# Patient Record
Sex: Male | Born: 1970 | Race: Black or African American | Hispanic: No | Marital: Single | Smoking: Light tobacco smoker
Health system: Southern US, Community
[De-identification: ages and names within clinical notes are randomized; demographics above are authoritative.]

## PROBLEM LIST (undated history)

## (undated) DIAGNOSIS — S0990XA Unspecified injury of head, initial encounter: Secondary | ICD-10-CM

## (undated) DIAGNOSIS — K37 Unspecified appendicitis: Secondary | ICD-10-CM

## (undated) DIAGNOSIS — W3400XA Accidental discharge from unspecified firearms or gun, initial encounter: Secondary | ICD-10-CM

## (undated) HISTORY — PX: APPENDECTOMY: SHX54

## (undated) HISTORY — PX: ABDOMINAL SURGERY: SHX537

## (undated) HISTORY — PX: KNEE SURGERY: SHX244

---

## 1996-04-03 DIAGNOSIS — W3400XA Accidental discharge from unspecified firearms or gun, initial encounter: Secondary | ICD-10-CM

## 1996-04-03 HISTORY — DX: Accidental discharge from unspecified firearms or gun, initial encounter: W34.00XA

## 2006-10-12 ENCOUNTER — Inpatient Hospital Stay (HOSPITAL_COMMUNITY): Admission: EM | Admit: 2006-10-12 | Discharge: 2006-10-14 | Payer: Self-pay | Admitting: Emergency Medicine

## 2007-07-18 ENCOUNTER — Emergency Department (HOSPITAL_COMMUNITY): Admission: EM | Admit: 2007-07-18 | Discharge: 2007-07-18 | Payer: Self-pay | Admitting: Emergency Medicine

## 2007-08-18 ENCOUNTER — Emergency Department (HOSPITAL_COMMUNITY): Admission: EM | Admit: 2007-08-18 | Discharge: 2007-08-18 | Payer: Self-pay | Admitting: Emergency Medicine

## 2008-02-19 ENCOUNTER — Ambulatory Visit (HOSPITAL_BASED_OUTPATIENT_CLINIC_OR_DEPARTMENT_OTHER): Admission: RE | Admit: 2008-02-19 | Discharge: 2008-02-19 | Payer: Self-pay | Admitting: Orthopedic Surgery

## 2008-05-09 ENCOUNTER — Emergency Department (HOSPITAL_COMMUNITY): Admission: EM | Admit: 2008-05-09 | Discharge: 2008-05-09 | Payer: Self-pay | Admitting: Emergency Medicine

## 2009-10-05 ENCOUNTER — Emergency Department (HOSPITAL_COMMUNITY): Admission: EM | Admit: 2009-10-05 | Discharge: 2009-10-05 | Payer: Self-pay | Admitting: Family Medicine

## 2010-01-23 ENCOUNTER — Emergency Department (HOSPITAL_COMMUNITY): Admission: EM | Admit: 2010-01-23 | Discharge: 2010-01-23 | Payer: Self-pay | Admitting: Family Medicine

## 2010-07-19 LAB — POCT RAPID STREP A (OFFICE): Streptococcus, Group A Screen (Direct): NEGATIVE

## 2010-08-16 NOTE — Op Note (Signed)
NAMECHASYN, Oconnor             ACCOUNT NO.:  [id number]   MEDICAL RECORD NO.:  [id number]          PATIENT TYPE:  AMB   LOCATION:  [facility]                         FACILITY:  [facility]   PHYSICIAN:  Ollen Gross, M.D.    DATE OF BIRTH:  31-May-1970   DATE OF PROCEDURE:  02/19/2008  DATE OF DISCHARGE:                               OPERATIVE REPORT   PREOPERATIVE DIAGNOSIS:  Right knee lateral meniscal tear.   POSTOPERATIVE DIAGNOSIS:  Right knee lateral meniscal tear plus anterior  cruciate ligament tear.   PROCEDURE:  Right knee arthroscopy with lateral meniscal debridement.   SURGEON:  Ollen Gross, M.D.   ASSISTANT:  None.   ANESTHESIA:  General.   ESTIMATED BLOOD LOSS:  Minimal.   DRAINS:  None.   COMPLICATIONS:  None.   CONDITION:  Stable to recovery.   BRIEF CLINICAL NOTE:  [name] is a 40 year old male who had an injury  to his right knee several months ago.  He had significant lateral sided  pain and mechanical symptoms.  Exam and history suggested lateral  meniscal tear confirmed by MRI.  MRI was also suggestive of possible  partial ACL tear.  He presents now for arthroscopy and debridement.   PROCEDURE IN DETAIL:  After successful administration of general  anesthetic a tourniquet was placed high on the right thigh and right  lower extremity prepped and draped in the usual sterile fashion.  Standard superomedial and inferolateral incisions were made.  Inflow  cannula passed superomedial and camera passed inferolateral.  Arthroscopic visualization proceeds.  Undersurface of the patella and  the trochlea looked normal.  The medial and lateral gutters were  visualized and looked normal.  Flexion and valgus force was applied to  the knee and the medial compartment is entered.  Spinal needle was used  to localize the inferomedial portal.  Small incision was made and  dilator placed.  The medial compartment looks completely normal.  The  intercondylar notch is  visualized and there is some bulbous degenerative  changes of the ACL as well as a fragment of the ACL which is flipped in  towards the anterior aspect of the joint.  Given that this is  potentially a cause of impingement I debrided it back to normal tissue.  It looks like there is at least a partial if not full tear of the ACL.  Some fibers do remain intact.  The lateral compartment is entered and  there is a tear in the body and anterior horn of the lateral meniscus.  This is debrided back to a stable base with basket and a 4.2 mm shaver  and sealed off with the ArthroCare device.  The joint is again  inspected.  There were no further tears or loose bodies noted.  There  were no chondral defects noted.  The arthroscopic equipment is then  removed from the inferior portals which were closed with interrupted 4-0  nylon.  Twenty mL of 0.25% Marcaine with epinephrine was injected into the knee  and then the inflow cannula was removed and that portal was closed with  nylon.  A bulky  sterile dressing was applied and he is awakened and  transported to recovery in a stable condition.      Ollen Gross, M.D.  Electronically Signed     FA/MEDQ  D:  02/19/2008  T:  02/19/2008  Job:  [id number]

## 2010-08-16 NOTE — Op Note (Signed)
Christopher Oconnor, Christopher Oconnor             ACCOUNT NO.:  [id number]   MEDICAL RECORD NO.:  [id number]          PATIENT TYPE:  INP   LOCATION:  2550                         FACILITY:  [facility]   PHYSICIAN:  Vanita Panda. Magnus Ivan, M.D.DATE OF BIRTH:  11/20/1970   DATE OF PROCEDURE:  10/12/2006  DATE OF DISCHARGE:                               OPERATIVE REPORT   PREOPERATIVE DIAGNOSIS:  Left hand abscess and cellulitis.   POSTOPERATIVE DIAGNOSES:  Lleft hand first web space and palmar abscess.   PROCEDURE:  Irrigation and debridement of left thumb and first web space  palmar abscess.   SURGEON:  Vanita Panda. Magnus Ivan, M.D.   ANESTHESIA:  General.   TOURNIQUET TIME:  20 minutes.   BLOOD LOSS:  Minimal.   ANTIBIOTICS:  IV Zosyn.   COMPLICATIONS:  None.   INDICATIONS:  Briefly, Christopher Oconnor is a 40 year old right-hand-dominant  male who noticed swelling in his left hand since Tuesday.  He has had a  wound in the first web space for some time now, but he is not sure what  that is from.  After continued pain and swelling in his hand, he came to  the emergency room today.  On examination he had, what I felt to be,  erythema and streaking in his arm with pain in the web space.  He was  able to flex and extend his thumb, but everything was quite painful to  him.  We recognized this was likely an abscess that needed to be  decompressed.  The risks and benefits of surgery were explained to him,  and he agreed to proceed with surgery.   PROCEDURE DESCRIPTION:  After informed consent was obtained and the  appropriate left arm was marked, he was brought to the operating room  and placed supine on the operating room table.  General anesthesia was  obtained and a nonsterile tourniquet was placed on his upper arm.  His  hand was prepped and draped with DuraPrep and sterile drapes.  A time-  out was called to identify the correct patient and correct extremity. I  raised the arm and elevated the  tourniquet to 250 mmHg pressure.  I then  made the incision directly in the first web space over the previous  small wound; gross milky purulence was encountered.  I then spread down  into the deep palmar space and thoroughly irrigated this wound and  decompressed the abscess.  I used bacitracin solution with normal saline  to thoroughly irrigate the wound.  I then made a small incision in the  palm at the distal carpal ligament, to make sure the deep space near the  transverse carpal ligament was without gross purulence.  It was found to  be clean.  I thoroughly irrigated this wound.  I made a second incision  on the dorsal surface of the thumb over the metacarpal and the MCP  joint, and found only edematous fluid in this area.  This was thoroughly  irrigated as well.  The thumb and palmar incision were closed with  interrupted 3-0 nylon suture; loosely closed the first web space  incision  after packing it with Xerofoam dressing.  The tourniquet was  let down at 20 minutes.  Hemostasis was obtained.  The hand pinkened  nicely.  I placed a well-padded sterile dressing over the wound.  The  patient was awakened,  extubated and taken to the recovery room in stable condition.  Cultures  were obtained intraoperatively, and he will be admitted on IV  antibiotics for serial exams.  Once this is improved and we have  cultures identified with an organism, we can transition to oral  antibiotics.      Vanita Panda. Magnus Ivan, M.D.  Electronically Signed     CYB/MEDQ  D:  10/12/2006  T:  10/14/2006  Job:  [id number]

## 2010-08-19 NOTE — Discharge Summary (Signed)
Christopher Oconnor, Christopher Oconnor             ACCOUNT NO.:  [id number]   MEDICAL RECORD NO.:  [id number]          PATIENT TYPE:  INP   LOCATION:  5037                         FACILITY:  [facility]   PHYSICIAN:  Vanita Panda. Magnus Ivan, M.D.DATE OF BIRTH:  1970/05/16   DATE OF ADMISSION:  10/12/2006  DATE OF DISCHARGE:  10/14/2006                               DISCHARGE SUMMARY   ADMITTING DIAGNOSIS:  Left hand abscess.   DISCHARGE DIAGNOSIS:  Left hand complicated abscess.   PROCEDURES:  Irrigation and debridement of left hand abscess.   HOSPITAL COURSE:  [name] is a 40 year old male who came to the  emergency room on the day of admission with pain in the first webspace  of his left hand and his palm.  He had findings consistent with an  abscess in the hand.  It was recommended he undergo irrigation and  debridement of this.  He was taken to the operating room on the day of  admission, where a left hand abscess was irrigated and debrided, and  there was gross purulence encountered.  The wound was packed, and he was  admitted for IV antibiotics.  For a detailed description of the  operation, please refer to the dictated operative note in the patient's  medical record.   The cultures did grow rare Gram negative rods, and he was on vancomycin  and Cipro.  By the day of discharge, the wound had improved greatly, and  it was felt that he could be discharged safely to home.  Instructions  were given for Dial soap soaks daily, as well as an antibiotic regimen.   DISPOSITION:  To home.   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  He was given prescriptions for Percocet for  pain, as well as oral Cipro and doxycycline.  Close followup will be in  the office in the next 2-4 days.      Vanita Panda. Magnus Ivan, M.D.  Electronically Signed     CYB/MEDQ  D:   11/11/2006  T:  11/11/2006  Job:  [id number]

## 2010-09-21 ENCOUNTER — Inpatient Hospital Stay (INDEPENDENT_AMBULATORY_CARE_PROVIDER_SITE_OTHER)
Admission: RE | Admit: 2010-09-21 | Discharge: 2010-09-21 | Disposition: A | Discharge status: Home / Self Care | Payer: 59 | Source: Ambulatory Visit | Attending: Emergency Medicine | Admitting: Emergency Medicine

## 2010-09-21 DIAGNOSIS — J45909 Unspecified asthma, uncomplicated: Secondary | ICD-10-CM

## 2011-01-03 LAB — POCT HEMOGLOBIN-HEMACUE: Hemoglobin: 15.4

## 2011-01-17 LAB — DIFFERENTIAL
Basophils Absolute: 0
Basophils Relative: 0
Monocytes Absolute: 0.7
Neutro Abs: 6.3

## 2011-01-17 LAB — CULTURE, BLOOD (ROUTINE X 2)
Culture: NO GROWTH
Culture: NO GROWTH

## 2011-01-17 LAB — POCT I-STAT CREATININE
Creatinine, Ser: 1.2
Operator id: 270651

## 2011-01-17 LAB — WOUND CULTURE

## 2011-01-17 LAB — CBC
Hemoglobin: 13.3
MCHC: 33.8
RDW: 13.2

## 2011-01-17 LAB — I-STAT 8, (EC8 V) (CONVERTED LAB)
BUN: 13
Chloride: 106
Glucose, Bld: 87
Potassium: 4.1
pH, Ven: 7.4 — ABNORMAL HIGH

## 2011-01-17 LAB — ANAEROBIC CULTURE

## 2011-05-28 ENCOUNTER — Emergency Department (HOSPITAL_COMMUNITY): Payer: 59

## 2011-05-28 ENCOUNTER — Emergency Department (HOSPITAL_COMMUNITY)
Admission: EM | Admit: 2011-05-28 | Discharge: 2011-05-28 | Disposition: A | Discharge status: Home / Self Care | Payer: 59 | Attending: Emergency Medicine | Admitting: Emergency Medicine

## 2011-05-28 ENCOUNTER — Encounter (HOSPITAL_COMMUNITY): Payer: Self-pay | Admitting: Emergency Medicine

## 2011-05-28 DIAGNOSIS — X58XXXA Exposure to other specified factors, initial encounter: Secondary | ICD-10-CM | POA: Insufficient documentation

## 2011-05-28 DIAGNOSIS — IMO0002 Reserved for concepts with insufficient information to code with codable children: Secondary | ICD-10-CM | POA: Insufficient documentation

## 2011-05-28 DIAGNOSIS — S39011A Strain of muscle, fascia and tendon of abdomen, initial encounter: Secondary | ICD-10-CM

## 2011-05-28 DIAGNOSIS — R05 Cough: Secondary | ICD-10-CM

## 2011-05-28 DIAGNOSIS — R109 Unspecified abdominal pain: Secondary | ICD-10-CM | POA: Insufficient documentation

## 2011-05-28 DIAGNOSIS — R059 Cough, unspecified: Secondary | ICD-10-CM | POA: Insufficient documentation

## 2011-05-28 HISTORY — DX: Unspecified appendicitis: K37

## 2011-05-28 LAB — DIFFERENTIAL
Basophils Relative: 0 % (ref 0–1)
Lymphs Abs: 1.5 10*3/uL (ref 0.7–4.0)
Monocytes Relative: 10 % (ref 3–12)
Neutro Abs: 4.6 10*3/uL (ref 1.7–7.7)
Neutrophils Relative %: 67 % (ref 43–77)

## 2011-05-28 LAB — COMPREHENSIVE METABOLIC PANEL
ALT: 21 U/L (ref 0–53)
Albumin: 3.7 g/dL (ref 3.5–5.2)
Alkaline Phosphatase: 71 U/L (ref 39–117)
BUN: 12 mg/dL (ref 6–23)
Chloride: 104 mEq/L (ref 96–112)
Glucose, Bld: 52 mg/dL — ABNORMAL LOW (ref 70–99)
Potassium: 4 mEq/L (ref 3.5–5.1)
Sodium: 140 mEq/L (ref 135–145)
Total Bilirubin: 0.2 mg/dL — ABNORMAL LOW (ref 0.3–1.2)

## 2011-05-28 LAB — GLUCOSE, CAPILLARY

## 2011-05-28 LAB — CBC
Hemoglobin: 14.2 g/dL (ref 13.0–17.0)
RBC: 4.58 MIL/uL (ref 4.22–5.81)

## 2011-05-28 LAB — URINALYSIS, ROUTINE W REFLEX MICROSCOPIC
Bilirubin Urine: NEGATIVE
Hgb urine dipstick: NEGATIVE
Ketones, ur: NEGATIVE mg/dL
Nitrite: NEGATIVE
pH: 7 (ref 5.0–8.0)

## 2011-05-28 LAB — LIPASE, BLOOD: Lipase: 50 U/L (ref 11–59)

## 2011-05-28 MED ORDER — BENZONATATE 100 MG PO CAPS: 100.0000 mg | ORAL_CAPSULE | Freq: Three times a day (TID) | ORAL | Status: AC

## 2011-05-28 NOTE — Discharge Instructions (Signed)

## 2011-05-28 NOTE — ED Provider Notes (Signed)
History     CSN: [id number]  Arrival date & time 05/28/11  1040   First MD Initiated Contact with Patient 05/28/11 1107      Chief Complaint  Patient presents with  . Abdominal Pain  . Cough    (Consider location/radiation/quality/duration/timing/severity/associated sxs/prior treatment) Patient is a 41 y.o. male presenting with abdominal pain and cough. The history is provided by the patient. No language interpreter was used.  Abdominal Pain The primary symptoms of the illness include abdominal pain. The primary symptoms of the illness do not include fever, fatigue, shortness of breath, nausea, vomiting, diarrhea or dysuria. Episode onset: 3 days ago. The onset of the illness was gradual. The problem has not changed since onset. The pain came on gradually. The abdominal pain is generalized. The abdominal pain does not radiate. The abdominal pain is relieved by nothing. The abdominal pain is exacerbated by movement.  The patient has not had a change in bowel habit. Symptoms associated with the illness do not include chills, anorexia, constipation, urgency, frequency or back pain.  Cough This is a new problem. The current episode started more than 2 days ago. The problem occurs every few minutes. The problem has not changed since onset.The cough is non-productive. There has been no fever. Pertinent negatives include no chest pain, no chills, no sweats, no ear congestion, no ear pain, no headaches, no rhinorrhea, no sore throat, no myalgias, no shortness of breath and no eye redness.    Past Medical History  Diagnosis Date  . Appendicitis     Past Surgical History  Procedure Date  . Abdominal surgery     History reviewed. No pertinent family history.  History  Substance Use Topics  . Smoking status: Current Some Day Smoker  . Smokeless tobacco: Not on file  . Alcohol Use: Yes      Review of Systems  Constitutional: Negative for fever, chills, activity change, appetite  change and fatigue.  HENT: Positive for congestion. Negative for ear pain, sore throat, rhinorrhea, neck pain and neck stiffness.   Eyes: Negative for redness.  Respiratory: Positive for cough. Negative for shortness of breath.   Cardiovascular: Negative for chest pain and palpitations.  Gastrointestinal: Positive for abdominal pain. Negative for nausea, vomiting, diarrhea, constipation and anorexia.  Genitourinary: Negative for dysuria, urgency, frequency and flank pain.  Musculoskeletal: Negative for myalgias, back pain and arthralgias.  Neurological: Negative for dizziness, weakness, light-headedness, numbness and headaches.  All other systems reviewed and are negative.    Allergies  Review of patient's allergies indicates no known allergies.  Home Medications   Current Outpatient Rx  Name Route Sig Dispense Refill  . HYDROCODONE-ACETAMINOPHEN 5-325 MG PO TABS Oral Take 1 tablet by mouth every 4 (four) hours as needed. For pain.    Marland Kitchen NAPROXEN SODIUM 220 MG PO TABS Oral Take 220 mg by mouth 2 (two) times daily as needed. For pain.    Marland Kitchen BENZONATATE 100 MG PO CAPS Oral Take 1 capsule (100 mg total) by mouth every 8 (eight) hours. 21 capsule 0    BP 116/67  Pulse 65  Temp(Src) 98 F (36.7 C) (Oral)  Resp 18  SpO2 100%  Physical Exam  Nursing note and vitals reviewed. Constitutional: He is oriented to person, place, and time. He appears well-developed and well-nourished. No distress.  HENT:  Head: Normocephalic and atraumatic.  Mouth/Throat: Oropharynx is clear and moist.  Eyes: Conjunctivae and EOM are normal. Pupils are equal, round, and reactive to light.  Neck: Normal range of motion. Neck supple.  Cardiovascular: Normal rate, regular rhythm, normal heart sounds and intact distal pulses.  Exam reveals no gallop and no friction rub.   No murmur heard. Pulmonary/Chest: Effort normal and breath sounds normal. No respiratory distress.  Abdominal: Soft. Bowel sounds are  normal. There is tenderness (suprapubic abd pain). There is no rebound and no guarding.       Prior abd surgery  Musculoskeletal: Normal range of motion. He exhibits no tenderness.  Neurological: He is alert and oriented to person, place, and time. No cranial nerve deficit.  Skin: Skin is warm and dry. No rash noted.    ED Course  Procedures (including critical care time)  Labs Reviewed  COMPREHENSIVE METABOLIC PANEL - Abnormal; Notable for the following:    Glucose, Bld 52 (*)    Total Bilirubin 0.2 (*)    All other components within normal limits  URINALYSIS, ROUTINE W REFLEX MICROSCOPIC - Abnormal; Notable for the following:    APPearance CLOUDY (*)    All other components within normal limits  GLUCOSE, CAPILLARY - Abnormal; Notable for the following:    Glucose-Capillary 143 (*)    All other components within normal limits  CBC  DIFFERENTIAL  LIPASE, BLOOD   Dg Abd Acute W/chest  05/28/2011  *RADIOLOGY REPORT*  Clinical Data: Nominal pain  ACUTE ABDOMEN SERIES (ABDOMEN 2 VIEW & CHEST 1 VIEW)  Comparison: 04/09/2007  Findings: Normal heart size.  Clear lungs.  No pneumothorax and no pleural effusion.  No disproportionate dilatation of bowel.  No free intraperitoneal gas.  IMPRESSION: No active cardiopulmonary disease.  Nonobstructive bowel gas pattern.  Original Report Authenticated By: Donavan Burnet, M.D.     1. Cough   2. Abdominal wall strain       MDM  Cough with abdominal pain secondary to abdominal wall strain. Given the history of recent abdominal surgeries an acute abdominal series was performed with no evidence of obstruction. Patient will be discharged home with [facility]. He has pain medication from a previous visit yesterday. Laboratory work is unremarkable. He did have a glucose of 52 for which he ate and had resolution of this. Instructed to followup with his primary care physician        Dayton Bailiff, MD 05/28/11 1308

## 2011-05-28 NOTE — ED Notes (Signed)
Pt given orange juice, sandwich and crackers. Will monitor sugar.

## 2011-05-28 NOTE — ED Notes (Signed)
Pt presents to department for evaluation of lower abdominal pain. Onset Thursday. Pt states 4/10 pain at the time. Abdomen soft and nontender to palpation. Bowel sounds present all quadrants. No nausea/vomiting. No urinary symptoms. Pt conscious alert and oriented x4. No signs of distress at the time.

## 2011-05-28 NOTE — ED Notes (Signed)
Pt c/o abdominal pain onset Thursday with cough.

## 2012-02-01 ENCOUNTER — Encounter (HOSPITAL_COMMUNITY): Payer: Self-pay | Admitting: Emergency Medicine

## 2012-02-01 ENCOUNTER — Emergency Department (HOSPITAL_COMMUNITY)
Admission: EM | Admit: 2012-02-01 | Discharge: 2012-02-01 | Disposition: A | Discharge status: Home / Self Care | Payer: 59 | Source: Home / Self Care

## 2012-02-01 DIAGNOSIS — M79603 Pain in arm, unspecified: Secondary | ICD-10-CM

## 2012-02-01 DIAGNOSIS — M79609 Pain in unspecified limb: Secondary | ICD-10-CM

## 2012-02-01 MED ORDER — DICLOFENAC SODIUM 75 MG PO TBEC: 75.0000 mg | DELAYED_RELEASE_TABLET | Freq: Two times a day (BID) | ORAL | Status: DC

## 2012-02-01 NOTE — ED Notes (Signed)
C/O right upper arm "tightness" for 3 weeks. Hx of neck pain. Denies tingling or numbness in fingers.

## 2012-02-01 NOTE — ED Provider Notes (Signed)
History     CSN: [id number]  Arrival date & time 02/01/12  0941   None     Chief Complaint  Patient presents with  . Arm Injury    (Consider location/radiation/quality/duration/timing/severity/associated sxs/prior treatment) Patient is a 41 y.o. male presenting with arm injury. The history is provided by the patient. No language interpreter was used.  Arm Injury  The incident occurred just prior to arrival. The incident occurred at home. The injury mechanism is unknown. There is an injury to the right upper arm. The patient is experiencing no pain. There were no sick contacts.    Past Medical History  Diagnosis Date  . Appendicitis     Past Surgical History  Procedure Date  . Abdominal surgery     No family history on file.  History  Substance Use Topics  . Smoking status: Current Some Day Smoker  . Smokeless tobacco: Not on file  . Alcohol Use: Yes      Review of Systems  All other systems reviewed and are negative.    Allergies  Review of patient's allergies indicates no known allergies.  Home Medications   Current Outpatient Rx  Name Route Sig Dispense Refill  . HYDROCODONE-ACETAMINOPHEN 5-325 MG PO TABS Oral Take 1 tablet by mouth every 4 (four) hours as needed. For pain.    Marland Kitchen NAPROXEN SODIUM 220 MG PO TABS Oral Take 220 mg by mouth 2 (two) times daily as needed. For pain.      BP 128/83  Pulse 70  Temp 98 F (36.7 C) (Oral)  Resp 16  SpO2 100%  Physical Exam  Nursing note and vitals reviewed. Constitutional: He appears well-developed and well-nourished.  HENT:  Head: Normocephalic and atraumatic.  Musculoskeletal: He exhibits tenderness.       Tender right bicep area, no swelling,  Good pulse, full range of motion,  Ns and nv intact  Neurological: He is alert.  Skin: Skin is warm.    ED Course  Procedures (including critical care time)  Labs Reviewed - No data to display No results found.   No diagnosis found.    MDM    Pt  given rx for voltaren.   I advised follow up with [facility] if pain persist      Lonia Skinner [place], [place] 02/01/12 1107

## 2012-02-01 NOTE — ED Provider Notes (Signed)
Medical screening examination/treatment/procedure(s) were performed by non-physician practitioner and as supervising physician I was immediately available for consultation/collaboration.  Raynald Blend, MD 02/01/12 1122

## 2013-03-18 ENCOUNTER — Emergency Department (HOSPITAL_COMMUNITY)
Admission: EM | Admit: 2013-03-18 | Discharge: 2013-03-18 | Disposition: A | Discharge status: Home / Self Care | Payer: 59 | Attending: Emergency Medicine | Admitting: Emergency Medicine

## 2013-03-18 ENCOUNTER — Encounter (HOSPITAL_COMMUNITY): Payer: Self-pay | Admitting: Emergency Medicine

## 2013-03-18 DIAGNOSIS — R1084 Generalized abdominal pain: Secondary | ICD-10-CM | POA: Insufficient documentation

## 2013-03-18 DIAGNOSIS — Z8719 Personal history of other diseases of the digestive system: Secondary | ICD-10-CM | POA: Insufficient documentation

## 2013-03-18 DIAGNOSIS — Z87828 Personal history of other (healed) physical injury and trauma: Secondary | ICD-10-CM | POA: Insufficient documentation

## 2013-03-18 DIAGNOSIS — F17200 Nicotine dependence, unspecified, uncomplicated: Secondary | ICD-10-CM | POA: Insufficient documentation

## 2013-03-18 DIAGNOSIS — Z9089 Acquired absence of other organs: Secondary | ICD-10-CM | POA: Insufficient documentation

## 2013-03-18 DIAGNOSIS — R109 Unspecified abdominal pain: Secondary | ICD-10-CM

## 2013-03-18 HISTORY — DX: Accidental discharge from unspecified firearms or gun, initial encounter: W34.00XA

## 2013-03-18 LAB — CBC WITH DIFFERENTIAL/PLATELET
Basophils Absolute: 0 10*3/uL (ref 0.0–0.1)
Basophils Relative: 0 % (ref 0–1)
Eosinophils Absolute: 0.2 10*3/uL (ref 0.0–0.7)
HCT: 43.4 % (ref 39.0–52.0)
Hemoglobin: 14.9 g/dL (ref 13.0–17.0)
Lymphocytes Relative: 30 % (ref 12–46)
Lymphs Abs: 1.4 10*3/uL (ref 0.7–4.0)
MCHC: 34.3 g/dL (ref 30.0–36.0)
MCV: 91.4 fL (ref 78.0–100.0)
Monocytes Absolute: 0.5 10*3/uL (ref 0.1–1.0)
Monocytes Relative: 12 % (ref 3–12)
Neutro Abs: 2.5 10*3/uL (ref 1.7–7.7)
RBC: 4.75 MIL/uL (ref 4.22–5.81)
RDW: 12.7 % (ref 11.5–15.5)
WBC: 4.6 10*3/uL (ref 4.0–10.5)

## 2013-03-18 LAB — URINALYSIS, ROUTINE W REFLEX MICROSCOPIC
Glucose, UA: NEGATIVE mg/dL
Hgb urine dipstick: NEGATIVE
Leukocytes, UA: NEGATIVE
pH: 7 (ref 5.0–8.0)

## 2013-03-18 LAB — BASIC METABOLIC PANEL
CO2: 32 mEq/L (ref 19–32)
Chloride: 100 mEq/L (ref 96–112)
Creatinine, Ser: 1.15 mg/dL (ref 0.50–1.35)
GFR calc non Af Amer: 78 mL/min — ABNORMAL LOW (ref >90)
Glucose, Bld: 94 mg/dL (ref 70–99)
Sodium: 138 mEq/L (ref 135–145)

## 2013-03-18 MED ORDER — TRAMADOL HCL 50 MG PO TABS: 50.0000 mg | ORAL_TABLET | Freq: Four times a day (QID) | ORAL | Status: DC | PRN

## 2013-03-18 MED ORDER — OXYCODONE-ACETAMINOPHEN 5-325 MG PO TABS
2.0000 | ORAL_TABLET | Freq: Once | ORAL | Status: AC
  Administered 2013-03-18: 2 via ORAL
  Filled 2013-03-18: qty 2

## 2013-03-18 MED ORDER — ONDANSETRON 4 MG PO TBDP
4.0000 mg | ORAL_TABLET | Freq: Once | ORAL | Status: AC
  Administered 2013-03-18: 4 mg via ORAL
  Filled 2013-03-18: qty 1
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
## 2013-03-18 NOTE — ED Notes (Addendum)
Pt reports intermittent lower abdominal pain x "years" but progressively worse since Saturday. Denies pain at this time. Denies nausea, diarrhea. Last normal BM yesterday.

## 2013-03-19 LAB — URINE CULTURE: Culture: NO GROWTH

## 2013-03-19 NOTE — ED Provider Notes (Signed)
Medical screening examination/treatment/procedure(s) were performed by non-physician practitioner and as supervising physician I was immediately available for consultation/collaboration.  EKG Interpretation   None       Devoria Albe, MD, Armando Gang   Ward Givens, MD 03/19/13 [phone]

## 2013-03-23 ENCOUNTER — Emergency Department (HOSPITAL_COMMUNITY)
Admission: EM | Admit: 2013-03-23 | Discharge: 2013-03-23 | Disposition: A | Discharge status: Home / Self Care | Payer: 59 | Attending: Emergency Medicine | Admitting: Emergency Medicine

## 2013-03-23 ENCOUNTER — Emergency Department (HOSPITAL_COMMUNITY): Payer: 59

## 2013-03-23 ENCOUNTER — Encounter (HOSPITAL_COMMUNITY): Payer: Self-pay | Admitting: Emergency Medicine

## 2013-03-23 DIAGNOSIS — R109 Unspecified abdominal pain: Secondary | ICD-10-CM

## 2013-03-23 DIAGNOSIS — Z87828 Personal history of other (healed) physical injury and trauma: Secondary | ICD-10-CM | POA: Insufficient documentation

## 2013-03-23 DIAGNOSIS — G8918 Other acute postprocedural pain: Secondary | ICD-10-CM | POA: Insufficient documentation

## 2013-03-23 DIAGNOSIS — Z8719 Personal history of other diseases of the digestive system: Secondary | ICD-10-CM | POA: Insufficient documentation

## 2013-03-23 DIAGNOSIS — F17200 Nicotine dependence, unspecified, uncomplicated: Secondary | ICD-10-CM | POA: Insufficient documentation

## 2013-03-23 LAB — CBC
HCT: 41.4 % (ref 39.0–52.0)
MCH: 31.1 pg (ref 26.0–34.0)
MCHC: 34.3 g/dL (ref 30.0–36.0)
MCV: 90.6 fL (ref 78.0–100.0)
Platelets: 254 10*3/uL (ref 150–400)
RBC: 4.57 MIL/uL (ref 4.22–5.81)
RDW: 12.6 % (ref 11.5–15.5)

## 2013-03-23 LAB — POCT I-STAT, CHEM 8
Calcium, Ion: 1.23 mmol/L (ref 1.12–1.23)
Chloride: 101 mEq/L (ref 96–112)
Creatinine, Ser: 1.3 mg/dL (ref 0.50–1.35)
HCT: 44 % (ref 39.0–52.0)
Hemoglobin: 15 g/dL (ref 13.0–17.0)
TCO2: 29 mmol/L (ref 0–100)

## 2013-03-23 MED ORDER — OXYCODONE-ACETAMINOPHEN 5-325 MG PO TABS
1.0000 | ORAL_TABLET | Freq: Once | ORAL | Status: AC
  Administered 2013-03-23: 1 via ORAL
  Filled 2013-03-23: qty 1

## 2013-03-23 MED ORDER — IOHEXOL 300 MG/ML  SOLN
100.0000 mL | Freq: Once | INTRAMUSCULAR | Status: AC | PRN
  Administered 2013-03-23: 100 mL via INTRAVENOUS

## 2013-03-23 MED ORDER — TRAMADOL HCL 50 MG PO TABS: 50.0000 mg | ORAL_TABLET | Freq: Four times a day (QID) | ORAL | Status: DC | PRN

## 2013-03-23 NOTE — ED Notes (Signed)
Pt reports continues to have abdominal pain ongoing for years. Pt seen here on the 16th for same. Reports when he picked up his prescriptions he noticed it was not the same medication that he was given while here in ED. Pt denies N/V/D.

## 2013-03-23 NOTE — ED Notes (Signed)
Patient finished with oral contrast.

## 2013-03-23 NOTE — ED Provider Notes (Signed)
Medical screening examination/treatment/procedure(s) were performed by non-physician practitioner and as supervising physician I was immediately available for consultation/collaboration.  EKG Interpretation   None         Gilda Crease, MD 03/23/13 [phone]

## 2013-03-23 NOTE — ED Notes (Signed)
Patient returned from CT
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
## 2013-03-23 NOTE — ED Notes (Signed)
Patient transported to CT 

## 2013-07-20 ENCOUNTER — Encounter (HOSPITAL_COMMUNITY): Payer: Self-pay | Admitting: Emergency Medicine

## 2013-07-20 ENCOUNTER — Emergency Department (HOSPITAL_COMMUNITY): Payer: 59

## 2013-07-20 ENCOUNTER — Emergency Department (HOSPITAL_COMMUNITY)
Admission: EM | Admit: 2013-07-20 | Discharge: 2013-07-20 | Disposition: A | Discharge status: Home / Self Care | Payer: 59 | Attending: Emergency Medicine | Admitting: Emergency Medicine

## 2013-07-20 DIAGNOSIS — F17200 Nicotine dependence, unspecified, uncomplicated: Secondary | ICD-10-CM | POA: Insufficient documentation

## 2013-07-20 DIAGNOSIS — R143 Flatulence: Secondary | ICD-10-CM

## 2013-07-20 DIAGNOSIS — Z9889 Other specified postprocedural states: Secondary | ICD-10-CM | POA: Insufficient documentation

## 2013-07-20 DIAGNOSIS — K660 Peritoneal adhesions (postprocedural) (postinfection): Secondary | ICD-10-CM | POA: Insufficient documentation

## 2013-07-20 DIAGNOSIS — R112 Nausea with vomiting, unspecified: Secondary | ICD-10-CM | POA: Insufficient documentation

## 2013-07-20 DIAGNOSIS — R141 Gas pain: Secondary | ICD-10-CM | POA: Insufficient documentation

## 2013-07-20 DIAGNOSIS — R109 Unspecified abdominal pain: Secondary | ICD-10-CM

## 2013-07-20 DIAGNOSIS — Z87828 Personal history of other (healed) physical injury and trauma: Secondary | ICD-10-CM | POA: Insufficient documentation

## 2013-07-20 DIAGNOSIS — R142 Eructation: Secondary | ICD-10-CM

## 2013-07-20 DIAGNOSIS — Z87442 Personal history of urinary calculi: Secondary | ICD-10-CM | POA: Insufficient documentation

## 2013-07-20 HISTORY — DX: Unspecified injury of head, initial encounter: S09.90XA

## 2013-07-20 LAB — COMPREHENSIVE METABOLIC PANEL
ALBUMIN: 3.9 g/dL (ref 3.5–5.2)
ALK PHOS: 65 U/L (ref 39–117)
ALT: 22 U/L (ref 0–53)
AST: 21 U/L (ref 0–37)
BUN: 14 mg/dL (ref 6–23)
CO2: 26 mEq/L (ref 19–32)
CREATININE: 0.87 mg/dL (ref 0.50–1.35)
Calcium: 9 mg/dL (ref 8.4–10.5)
Chloride: 105 mEq/L (ref 96–112)
GFR calc non Af Amer: >90 mL/min (ref >90)
GLUCOSE: 103 mg/dL — AB (ref 70–99)
POTASSIUM: 4.2 meq/L (ref 3.7–5.3)
Sodium: 142 mEq/L (ref 137–147)
Total Bilirubin: 0.3 mg/dL (ref 0.3–1.2)
Total Protein: 7.1 g/dL (ref 6.0–8.3)

## 2013-07-20 LAB — CBC WITH DIFFERENTIAL/PLATELET
BASOS PCT: 0 % (ref 0–1)
Basophils Absolute: 0 10*3/uL (ref 0.0–0.1)
EOS ABS: 0.1 10*3/uL (ref 0.0–0.7)
Eosinophils Relative: 1 % (ref 0–5)
HCT: 44.1 % (ref 39.0–52.0)
HEMOGLOBIN: 14.7 g/dL (ref 13.0–17.0)
LYMPHS ABS: 1 10*3/uL (ref 0.7–4.0)
Lymphocytes Relative: 15 % (ref 12–46)
MCH: 30.3 pg (ref 26.0–34.0)
MCHC: 33.3 g/dL (ref 30.0–36.0)
MCV: 90.9 fL (ref 78.0–100.0)
MONO ABS: 0.6 10*3/uL (ref 0.1–1.0)
MONOS PCT: 9 % (ref 3–12)
NEUTROS PCT: 75 % (ref 43–77)
Neutro Abs: 5 10*3/uL (ref 1.7–7.7)
Platelets: 225 10*3/uL (ref 150–400)
RBC: 4.85 MIL/uL (ref 4.22–5.81)
RDW: 12.9 % (ref 11.5–15.5)
WBC: 6.6 10*3/uL (ref 4.0–10.5)

## 2013-07-20 LAB — LIPASE, BLOOD: LIPASE: 26 U/L (ref 11–59)

## 2013-07-20 MED ORDER — HYDROCODONE-ACETAMINOPHEN 5-325 MG PO TABS: 1.0000 | ORAL_TABLET | Freq: Four times a day (QID) | ORAL | Status: DC | PRN

## 2013-07-20 MED ORDER — IOHEXOL 300 MG/ML  SOLN
100.0000 mL | Freq: Once | INTRAMUSCULAR | Status: AC | PRN
  Administered 2013-07-20: 100 mL via INTRAVENOUS

## 2013-07-20 MED ORDER — HYDROMORPHONE HCL PF 1 MG/ML IJ SOLN
1.0000 mg | Freq: Once | INTRAMUSCULAR | Status: DC
  Filled 2013-07-20: qty 1

## 2013-07-20 MED ORDER — IOHEXOL 300 MG/ML  SOLN
25.0000 mL | INTRAMUSCULAR | Status: DC | PRN
  Administered 2013-07-20: 25 mL via ORAL

## 2013-07-20 MED ORDER — HYDROMORPHONE HCL PF 1 MG/ML IJ SOLN
1.0000 mg | Freq: Once | INTRAMUSCULAR | Status: AC
  Administered 2013-07-20: 1 mg via INTRAVENOUS
  Filled 2013-07-20: qty 1

## 2013-07-20 MED ORDER — ONDANSETRON HCL 4 MG/2ML IJ SOLN
4.0000 mg | Freq: Once | INTRAMUSCULAR | Status: AC
  Administered 2013-07-20: 4 mg via INTRAVENOUS
  Filled 2013-07-20: qty 2

## 2013-07-20 MED ORDER — POLYETHYLENE GLYCOL 3350 17 GM/SCOOP PO POWD: ORAL | Status: DC

## 2013-07-20 MED ORDER — SODIUM CHLORIDE 0.9 % IV BOLUS (SEPSIS)
1000.0000 mL | Freq: Once | INTRAVENOUS | Status: AC
  Administered 2013-07-20: 1000 mL via INTRAVENOUS

## 2013-07-20 MED ORDER — MORPHINE SULFATE 4 MG/ML IJ SOLN
4.0000 mg | Freq: Once | INTRAMUSCULAR | Status: AC
  Administered 2013-07-20: 4 mg via INTRAVENOUS
  Filled 2013-07-20: qty 1

## 2013-07-20 NOTE — Discharge Instructions (Signed)
Abdominal Pain, Adult Many things can cause abdominal pain. Usually, abdominal pain is not caused by a disease and will improve without treatment. It can often be observed and treated at home. Your health care provider will do a physical exam and possibly order blood tests and X-rays to help determine the seriousness of your pain. However, in many cases, more time must pass before a clear cause of the pain can be found. Before that point, your health care provider may not know if you need more testing or further treatment. HOME CARE INSTRUCTIONS  Monitor your abdominal pain for any changes. The following actions may help to alleviate any discomfort you are experiencing:  Only take over-the-counter or prescription medicines as directed by your health care provider.  Do not take laxatives unless directed to do so by your health care provider.  Try a clear liquid diet (broth, tea, or water) as directed by your health care provider. Slowly move to a bland diet as tolerated. SEEK MEDICAL CARE IF:  You have unexplained abdominal pain.  You have abdominal pain associated with nausea or diarrhea.  You have pain when you urinate or have a bowel movement.  You experience abdominal pain that wakes you in the night.  You have abdominal pain that is worsened or improved by eating food.  You have abdominal pain that is worsened with eating fatty foods. SEEK IMMEDIATE MEDICAL CARE IF:   Your pain does not go away within 2 hours.  You have a fever.  You keep throwing up (vomiting).  Your pain is felt only in portions of the abdomen, such as the right side or the left lower portion of the abdomen.  You pass bloody or black tarry stools. MAKE SURE YOU:  Understand these instructions.   Will watch your condition.   Will get help right away if you are not doing well or get worse.  Document Released: 12/28/2004 Document Revised: 01/08/2013 Document Reviewed: 11/27/2012 [facility] Patient  Information 2014 [place], [place].  Adhesions Adhesions are stringy (fibrous) bands of tissue. Adhesions are similar to scars, but they are on the inside of your body. Adhesions form between two surfaces of the body. CAUSES   The most common adhesions are those that occur following surgery. Touching and moving things within the belly (abdomen) leads to inflammation and adhesions can form. Not all people get adhesions following surgery. But, adhesions may happen even with the gentlest handling of the organs in the abdomen. There is no way to predict who will have adhesions. The adhesions can occur between:  Loops of bowel.  The bowel and other organs such as the liver.  The contents in the pelvis such as the uterus, bladder, ovaries and tubes.  Inflammation within the abdomen even if there is no surgery. An example would be an infection in the tubes and uterus (pelvic inflammatory disease). Any infection will cause inflammation that may lead to adhesions.  Radiation treatment. SYMPTOMS  Many people have no signs or symptoms from adhesions. However, adhesions may cause a number of symptoms. Some of these are:  Abdominal pain, tenderness or cramping.  Abdominal bloating.  Constipation or diarrhea.  Vomiting.  Painful sex.  In females, difficulty getting pregnant. DIAGNOSIS   An exam by your caregiver may suggest that adhesions are present.  A surgical procedure using a small incision and a thin scope can be used to look inside the abdomen at the adhesions.  Sometimes x-rays may suggest adhesions inside the abdomen. TREATMENT  Surgery can be performed to separate the adhesions. This often takes care of the problems caused by the adhesions, although surgery may also cause more adhesions to develop. PROGNOSIS   The outcome is usually good if an operation is used to fix adhesions inside the belly.  All adhesions may reoccur and the problem can come back. HOME CARE INSTRUCTIONS    Take usual medications as directed by your caregiver.  Only take over-the-counter or prescription medicines for pain, discomfort or fever as directed by your caregiver.  Pay attention to the pain:  Has it changed?  Has it moved?  Is it gone?  Do not eat solid food until your pain is gone.  While you have pain: Stay on a clear liquid diet. A clear liquid is one you can see through (water, weak tea, broth or bouillon, lemon lime carbonated drinks, gelatin, popsicles or ice chips).  When your pain is gone: Start a light diet (dry toast, crackers, applesauce, white rice, bananas, broth or bouillon). Increase the diet slowly as long as it does not bother you. No dairy products (including cheese and eggs) and no spicy, fatty, fried or high fiber foods. Your caregiver will tell you if you should be on a special diet.  No alcohol, caffeine or cigarettes.  If your caregiver has given you a follow-up appointment, it is very important to keep that appointment. Not keeping the appointment could result in a permanent injury or lasting (chronic) pain or disability. SEEK IMMEDIATE MEDICAL CARE IF:   Your pain is not gone in 24 hours.  Your pain becomes worse, changes location or feels different.  You have a fever.  Your vomiting will not stop.  You have blood or brown flecks (like coffee grounds) in your vomit.  You have blood in your bowel movements.  Your bowel movements are dark or black.  Your bowel movements stop (become blocked) or you can not pass gas. Document Released: 06/10/2003 Document Revised: 06/12/2011 Document Reviewed: 01/08/2009 [facility] Patient Information 2014 [place], [place].
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
## 2013-07-20 NOTE — ED Notes (Signed)
Cranberry juice given pt tolerating well, no complaints of pain

## 2013-07-20 NOTE — ED Notes (Signed)
Pt states abdominal surgery in the past, but he noticed an area by his umbilicus that is swollen. Pt states that he sometimes does get swelling in that area, slight bulge noticed in triage, pt states pain for touch and states vomiting as well.

## 2013-07-23 NOTE — ED Provider Notes (Signed)
Medical screening examination/treatment/procedure(s) were performed by non-physician practitioner and as supervising physician I was immediately available for consultation/collaboration.   EKG Interpretation None        Gwyneth SproutWhitney Legrande Hao, MD 07/23/13 [phone]

## 2013-08-06 ENCOUNTER — Ambulatory Visit (INDEPENDENT_AMBULATORY_CARE_PROVIDER_SITE_OTHER): Payer: Self-pay | Admitting: Surgery

## 2013-08-18 ENCOUNTER — Encounter (INDEPENDENT_AMBULATORY_CARE_PROVIDER_SITE_OTHER): Payer: Self-pay | Admitting: Surgery

## 2013-08-19 ENCOUNTER — Ambulatory Visit (INDEPENDENT_AMBULATORY_CARE_PROVIDER_SITE_OTHER): Payer: 59 | Admitting: Surgery

## 2013-08-19 ENCOUNTER — Encounter (INDEPENDENT_AMBULATORY_CARE_PROVIDER_SITE_OTHER): Payer: Self-pay | Admitting: Surgery

## 2013-08-19 VITALS — BP 124/80 | HR 72 | Temp 97.6°F | Resp 12 | Ht 70.0 in | Wt 169.6 lb

## 2013-08-19 DIAGNOSIS — K56609 Unspecified intestinal obstruction, unspecified as to partial versus complete obstruction: Secondary | ICD-10-CM

## 2013-08-19 DIAGNOSIS — K56600 Partial intestinal obstruction, unspecified as to cause: Secondary | ICD-10-CM
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
## 2015-01-10 ENCOUNTER — Emergency Department (HOSPITAL_COMMUNITY)
Admission: EM | Admit: 2015-01-10 | Discharge: 2015-01-10 | Disposition: A | Discharge status: Home / Self Care | Payer: Self-pay | Attending: Emergency Medicine | Admitting: Emergency Medicine

## 2015-01-10 ENCOUNTER — Encounter (HOSPITAL_COMMUNITY): Payer: Self-pay | Admitting: Emergency Medicine

## 2015-01-10 DIAGNOSIS — J0100 Acute maxillary sinusitis, unspecified: Secondary | ICD-10-CM | POA: Insufficient documentation

## 2015-01-10 DIAGNOSIS — Z720 Tobacco use: Secondary | ICD-10-CM | POA: Insufficient documentation

## 2015-01-10 MED ORDER — AMOXICILLIN-POT CLAVULANATE 500-125 MG PO TABS: 1.0000 | ORAL_TABLET | Freq: Three times a day (TID) | ORAL | Status: AC

## 2015-01-10 NOTE — ED Notes (Signed)
Pt reports with cold sx x 1 week with ear pain beginning yesterday.  NAD noted at this time.

## 2015-01-10 NOTE — Discharge Instructions (Signed)
Use an over-the-counter cold medicine that contains a decongestion  Sinusitis, Adult Sinusitis is redness, soreness, and inflammation of the paranasal sinuses. Paranasal sinuses are air pockets within the bones of your face. They are located beneath your eyes, in the middle of your forehead, and above your eyes. In healthy paranasal sinuses, mucus is able to drain out, and air is able to circulate through them by way of your nose. However, when your paranasal sinuses are inflamed, mucus and air can become trapped. This can allow bacteria and other germs to grow and cause infection. Sinusitis can develop quickly and last only a short time (acute) or continue over a long period (chronic). Sinusitis that lasts for more than 12 weeks is considered chronic. CAUSES Causes of sinusitis include:  Allergies.  Structural abnormalities, such as displacement of the cartilage that separates your nostrils (deviated septum), which can decrease the air flow through your nose and sinuses and affect sinus drainage.  Functional abnormalities, such as when the small hairs (cilia) that line your sinuses and help remove mucus do not work properly or are not present. SIGNS AND SYMPTOMS Symptoms of acute and chronic sinusitis are the same. The primary symptoms are pain and pressure around the affected sinuses. Other symptoms include:  Upper toothache.  Earache.  Headache.  Bad breath.  Decreased sense of smell and taste.  A cough, which worsens when you are lying flat.  Fatigue.  Fever.  Thick drainage from your nose, which often is green and may contain pus (purulent).  Swelling and warmth over the affected sinuses. DIAGNOSIS Your health care provider will perform a physical exam. During your exam, your health care provider may perform any of the following to help determine if you have acute sinusitis or chronic sinusitis:  Look in your nose for signs of abnormal growths in your nostrils (nasal  polyps).  Tap over the affected sinus to check for signs of infection.  View the inside of your sinuses using an imaging device that has a light attached (endoscope). If your health care provider suspects that you have chronic sinusitis, one or more of the following tests may be recommended:  Allergy tests.  Nasal culture. A sample of mucus is taken from your nose, sent to a lab, and screened for bacteria.  Nasal cytology. A sample of mucus is taken from your nose and examined by your health care provider to determine if your sinusitis is related to an allergy. TREATMENT Most cases of acute sinusitis are related to a viral infection and will resolve on their own within 10 days. Sometimes, medicines are prescribed to help relieve symptoms of both acute and chronic sinusitis. These may include pain medicines, decongestants, nasal steroid sprays, or saline sprays. However, for sinusitis related to a bacterial infection, your health care provider will prescribe antibiotic medicines. These are medicines that will help kill the bacteria causing the infection. Rarely, sinusitis is caused by a fungal infection. In these cases, your health care provider will prescribe antifungal medicine. For some cases of chronic sinusitis, surgery is needed. Generally, these are cases in which sinusitis recurs more than 3 times per year, despite other treatments. HOME CARE INSTRUCTIONS  Drink plenty of water. Water helps thin the mucus so your sinuses can drain more easily.  Use a humidifier.  Inhale steam 3-4 times a day (for example, sit in the bathroom with the shower running).  Apply a warm, moist washcloth to your face 3-4 times a day, or as directed by your  health care provider.  Use saline nasal sprays to help moisten and clean your sinuses.  Take medicines only as directed by your health care provider.  If you were prescribed either an antibiotic or antifungal medicine, finish it all even if you start  to feel better. SEEK IMMEDIATE MEDICAL CARE IF:  You have increasing pain or severe headaches.  You have nausea, vomiting, or drowsiness.  You have swelling around your face.  You have vision problems.  You have a stiff neck.  You have difficulty breathing.   This information is not intended to replace advice given to you by your health care provider. Make sure you discuss any questions you have with your health care provider.   Document Released: 03/20/2005 Document Revised: 04/10/2014 Document Reviewed: 04/04/2011 Elsevier Interactive Patient Education [employer]

## 2015-01-10 NOTE — ED Notes (Signed)
MD at bedside. 
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
## 2015-01-16 ENCOUNTER — Emergency Department (HOSPITAL_COMMUNITY)
Admission: EM | Admit: 2015-01-16 | Discharge: 2015-01-16 | Disposition: A | Discharge status: Home / Self Care | Payer: 59 | Attending: Emergency Medicine | Admitting: Emergency Medicine

## 2015-01-16 ENCOUNTER — Encounter (HOSPITAL_COMMUNITY): Payer: Self-pay

## 2015-01-16 DIAGNOSIS — R1084 Generalized abdominal pain: Secondary | ICD-10-CM | POA: Insufficient documentation

## 2015-01-16 DIAGNOSIS — R197 Diarrhea, unspecified: Secondary | ICD-10-CM | POA: Insufficient documentation

## 2015-01-16 DIAGNOSIS — Z8719 Personal history of other diseases of the digestive system: Secondary | ICD-10-CM | POA: Diagnosis not present

## 2015-01-16 DIAGNOSIS — Z87828 Personal history of other (healed) physical injury and trauma: Secondary | ICD-10-CM | POA: Insufficient documentation

## 2015-01-16 DIAGNOSIS — Z720 Tobacco use: Secondary | ICD-10-CM | POA: Diagnosis not present

## 2015-01-16 DIAGNOSIS — Z792 Long term (current) use of antibiotics: Secondary | ICD-10-CM | POA: Insufficient documentation

## 2015-01-16 LAB — COMPREHENSIVE METABOLIC PANEL
ALK PHOS: 74 U/L (ref 38–126)
ALT: 30 U/L (ref 17–63)
AST: 30 U/L (ref 15–41)
Albumin: 3.7 g/dL (ref 3.5–5.0)
Anion gap: 10 (ref 5–15)
BUN: 11 mg/dL (ref 6–20)
CO2: 23 mmol/L (ref 22–32)
CREATININE: 1.07 mg/dL (ref 0.61–1.24)
Calcium: 9 mg/dL (ref 8.9–10.3)
Chloride: 104 mmol/L (ref 101–111)
Glucose, Bld: 158 mg/dL — ABNORMAL HIGH (ref 65–99)
Potassium: 3.8 mmol/L (ref 3.5–5.1)
Sodium: 137 mmol/L (ref 135–145)
Total Bilirubin: 0.6 mg/dL (ref 0.3–1.2)
Total Protein: 6.5 g/dL (ref 6.5–8.1)

## 2015-01-16 LAB — CBC WITH DIFFERENTIAL/PLATELET
Basophils Absolute: 0 10*3/uL (ref 0.0–0.1)
Basophils Relative: 0 %
Eosinophils Absolute: 0.1 10*3/uL (ref 0.0–0.7)
Eosinophils Relative: 4 %
HCT: 42.1 % (ref 39.0–52.0)
HEMOGLOBIN: 14.5 g/dL (ref 13.0–17.0)
LYMPHS ABS: 0.9 10*3/uL (ref 0.7–4.0)
Lymphocytes Relative: 24 %
MCH: 30.3 pg (ref 26.0–34.0)
MCHC: 34.4 g/dL (ref 30.0–36.0)
MCV: 88.1 fL (ref 78.0–100.0)
Monocytes Absolute: 0.4 10*3/uL (ref 0.1–1.0)
Monocytes Relative: 10 %
NEUTROS PCT: 62 %
Neutro Abs: 2.5 10*3/uL (ref 1.7–7.7)
Platelets: 278 10*3/uL (ref 150–400)
RBC: 4.78 MIL/uL (ref 4.22–5.81)
RDW: 12.5 % (ref 11.5–15.5)
WBC: 4 10*3/uL (ref 4.0–10.5)

## 2015-01-16 LAB — URINALYSIS, ROUTINE W REFLEX MICROSCOPIC
Bilirubin Urine: NEGATIVE
Glucose, UA: NEGATIVE mg/dL
HGB URINE DIPSTICK: NEGATIVE
Ketones, ur: NEGATIVE mg/dL
Leukocytes, UA: NEGATIVE
Nitrite: NEGATIVE
PROTEIN: NEGATIVE mg/dL
Specific Gravity, Urine: 1.028 (ref 1.005–1.030)
Urobilinogen, UA: 0.2 mg/dL (ref 0.0–1.0)
pH: 5 (ref 5.0–8.0)

## 2015-01-16 LAB — LIPASE, BLOOD: LIPASE: 36 U/L (ref 22–51)

## 2015-01-16 MED ORDER — LOPERAMIDE HCL 2 MG PO CAPS
2.0000 mg | ORAL_CAPSULE | Freq: Once | ORAL | Status: AC
  Administered 2015-01-16: 2 mg via ORAL
  Filled 2015-01-16: qty 1

## 2015-01-16 MED ORDER — LOPERAMIDE HCL 2 MG PO CAPS: 2.0000 mg | ORAL_CAPSULE | ORAL | Status: AC | PRN

## 2015-01-16 MED ORDER — DICYCLOMINE HCL 10 MG PO CAPS
20.0000 mg | ORAL_CAPSULE | Freq: Once | ORAL | Status: AC
  Administered 2015-01-16: 20 mg via ORAL
  Filled 2015-01-16: qty 2

## 2015-01-16 NOTE — Discharge Instructions (Signed)
Diarrhea Christopher Oconnor, if you continued to have diarrhea in 24 hours, take the medication prescribed to you. See your primary care doctor within 3 days for close follow-up. If any symptoms worsen come back to the emergency department immediately. Thank you. Diarrhea is watery poop (stool). It can make you feel weak, tired, thirsty, or give you a dry mouth (signs of dehydration). Watery poop is a sign of another problem, most often an infection. It often lasts 2-3 days. It can last longer if it is a sign of something serious. Take care of yourself as told by your doctor. HOME CARE   Drink 1 cup (8 ounces) of fluid each time you have watery poop.  Do not drink the following fluids:  Those that contain simple sugars (fructose, glucose, galactose, lactose, sucrose, maltose).  Sports drinks.  Fruit juices.  Whole milk products.  Sodas.  Drinks with caffeine (coffee, tea, soda) or alcohol.  Oral rehydration solution may be used if the doctor says it is okay. You may make your own solution. Follow this recipe:   - teaspoon table salt.   teaspoon baking soda.   teaspoon salt substitute containing potassium chloride.  1 tablespoons sugar.  1 liter (34 ounces) of water.  Avoid the following foods:  High fiber foods, such as raw fruits and vegetables.  Nuts, seeds, and whole grain breads and cereals.   Those that are sweetened with sugar alcohols (xylitol, sorbitol, mannitol).  Try eating the following foods:  Starchy foods, such as rice, toast, pasta, low-sugar cereal, oatmeal, baked potatoes, crackers, and bagels.  Bananas.  Applesauce.  Eat probiotic-rich foods, such as yogurt and milk products that are fermented.  Wash your hands well after each time you have watery poop.  Only take medicine as told by your doctor.  Take a warm bath to help lessen burning or pain from having watery poop. GET HELP RIGHT AWAY IF:   You cannot drink fluids without throwing up  (vomiting).  You keep throwing up.  You have blood in your poop, or your poop looks black and tarry.  You do not pee (urinate) in 6-8 hours, or there is only a small amount of very dark pee.  You have belly (abdominal) pain that gets worse or stays in the same spot (localizes).  You are weak, dizzy, confused, or light-headed.  You have a very bad headache.  Your watery poop gets worse or does not get better.  You have a fever or lasting symptoms for more than 2-3 days.  You have a fever and your symptoms suddenly get worse. MAKE SURE YOU:   Understand these instructions.  Will watch your condition.  Will get help right away if you are not doing well or get worse.   This information is not intended to replace advice given to you by your health care provider. Make sure you discuss any questions you have with your health care provider.   Document Released: 09/06/2007 Document Revised: 04/10/2014 Document Reviewed: 11/26/2011 Elsevier Interactive Patient Education 2016 [employer]. Food Choices to Help Relieve Diarrhea, Adult When you have diarrhea, the foods you eat and your eating habits are very important. Choosing the right foods and drinks can help relieve diarrhea. Also, because diarrhea can last up to 7 days, you need to replace lost fluids and electrolytes (such as sodium, potassium, and chloride) in order to help prevent dehydration.  WHAT GENERAL GUIDELINES DO I NEED TO FOLLOW?  Slowly drink 1 cup (8 oz) of fluid  for each episode of diarrhea. If you are getting enough fluid, your urine will be clear or pale yellow.  Eat starchy foods. Some good choices include white rice, white toast, pasta, low-fiber cereal, baked potatoes (without the skin), saltine crackers, and bagels.  Avoid large servings of any cooked vegetables.  Limit fruit to two servings per day. A serving is  cup or 1 small piece.  Choose foods with less than 2 g of fiber per serving.  Limit fats to  less than 8 tsp (38 g) per day.  Avoid fried foods.  Eat foods that have probiotics in them. Probiotics can be found in certain dairy products.  Avoid foods and beverages that may increase the speed at which food moves through the stomach and intestines (gastrointestinal tract). Things to avoid include:  High-fiber foods, such as dried fruit, raw fruits and vegetables, nuts, seeds, and whole grain foods.  Spicy foods and high-fat foods.  Foods and beverages sweetened with high-fructose corn syrup, honey, or sugar alcohols such as xylitol, sorbitol, and mannitol. WHAT FOODS ARE RECOMMENDED? Grains White rice. White, [place], or pita breads (fresh or toasted), including plain rolls, buns, or bagels. White pasta. Saltine, soda, or graham crackers. Pretzels. Low-fiber cereal. Cooked cereals made with water (such as cornmeal, farina, or cream cereals). Plain muffins. Matzo. Melba toast. Zwieback.  Vegetables Potatoes (without the skin). Strained tomato and vegetable juices. Most well-cooked and canned vegetables without seeds. Tender lettuce. Fruits Cooked or canned applesauce, apricots, cherries, fruit cocktail, grapefruit, peaches, pears, or plums. Fresh bananas, apples without skin, cherries, grapes, cantaloupe, grapefruit, peaches, oranges, or plums.  Meat and Other Protein Products Baked or boiled chicken. Eggs. Tofu. Fish. Seafood. Smooth peanut butter. Ground or well-cooked tender beef, ham, veal, lamb, pork, or poultry.  Dairy Plain yogurt, kefir, and unsweetened liquid yogurt. Lactose-free milk, buttermilk, or soy milk. Plain hard cheese. Beverages Sport drinks. Clear broths. Diluted fruit juices (except prune). Regular, caffeine-free sodas such as ginger ale. Water. Decaffeinated teas. Oral rehydration solutions. Sugar-free beverages not sweetened with sugar alcohols. Other Bouillon, broth, or soups made from recommended foods.  The items listed above may not be a complete list of  recommended foods or beverages. Contact your dietitian for more options. WHAT FOODS ARE NOT RECOMMENDED? Grains Whole grain, whole wheat, bran, or rye breads, rolls, pastas, crackers, and cereals. Wild or brown rice. Cereals that contain more than 2 g of fiber per serving. Corn tortillas or taco shells. Cooked or dry oatmeal. Granola. Popcorn. Vegetables Raw vegetables. Cabbage, broccoli, Brussels sprouts, artichokes, baked beans, beet greens, corn, kale, legumes, peas, sweet potatoes, and yams. Potato skins. Cooked spinach and cabbage. Fruits Dried fruit, including raisins and dates. Raw fruits. Stewed or dried prunes. Fresh apples with skin, apricots, mangoes, pears, raspberries, and strawberries.  Meat and Other Protein Products Chunky peanut butter. Nuts and seeds. Beans and lentils. Tomasa BlaseBacon.  Dairy High-fat cheeses. Milk, chocolate milk, and beverages made with milk, such as milk shakes. Cream. Ice cream. Sweets and Desserts Sweet rolls, doughnuts, and sweet breads. Pancakes and waffles. Fats and Oils Butter. Cream sauces. Margarine. Salad oils. Plain salad dressings. Olives. Avocados.  Beverages Caffeinated beverages (such as coffee, tea, soda, or energy drinks). Alcoholic beverages. Fruit juices with pulp. Prune juice. Soft drinks sweetened with high-fructose corn syrup or sugar alcohols. Other Coconut. Hot sauce. Chili powder. Mayonnaise. Gravy. Cream-based or milk-based soups.  The items listed above may not be a complete list of foods and beverages to avoid. Contact your  dietitian for more information. WHAT SHOULD I DO IF I BECOME DEHYDRATED? Diarrhea can sometimes lead to dehydration. Signs of dehydration include dark urine and dry mouth and skin. If you think you are dehydrated, you should rehydrate with an oral rehydration solution. These solutions can be purchased at pharmacies, retail stores, or online.  Drink -1 cup (120-240 mL) of oral rehydration solution each time you have an  episode of diarrhea. If drinking this amount makes your diarrhea worse, try drinking smaller amounts more often. For example, drink 1-3 tsp (5-15 mL) every 5-10 minutes.  A general rule for staying hydrated is to drink 1-2 L of fluid per day. Talk to your health care provider about the specific amount you should be drinking each day. Drink enough fluids to keep your urine clear or pale yellow.   This information is not intended to replace advice given to you by your health care provider. Make sure you discuss any questions you have with your health care provider.   Document Released: 06/10/2003 Document Revised: 04/10/2014 Document Reviewed: 02/10/2013 Elsevier Interactive Patient Education [employer]
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
## 2015-01-16 NOTE — ED Notes (Signed)
MD at bedside. 

## 2015-01-16 NOTE — ED Notes (Signed)
Pt reports abd pain and diarrhea for 3-4 days, reports he wants to get better so he does not have to be out of work.

## 2015-01-24 ENCOUNTER — Emergency Department (HOSPITAL_COMMUNITY)
Admission: EM | Admit: 2015-01-24 | Discharge: 2015-01-24 | Disposition: A | Discharge status: Home / Self Care | Payer: 59 | Attending: Emergency Medicine | Admitting: Emergency Medicine

## 2015-01-24 ENCOUNTER — Emergency Department (HOSPITAL_COMMUNITY): Payer: 59

## 2015-01-24 ENCOUNTER — Encounter (HOSPITAL_COMMUNITY): Payer: Self-pay

## 2015-01-24 DIAGNOSIS — Z87828 Personal history of other (healed) physical injury and trauma: Secondary | ICD-10-CM | POA: Insufficient documentation

## 2015-01-24 DIAGNOSIS — Z79899 Other long term (current) drug therapy: Secondary | ICD-10-CM | POA: Diagnosis not present

## 2015-01-24 DIAGNOSIS — K859 Acute pancreatitis without necrosis or infection, unspecified: Secondary | ICD-10-CM | POA: Insufficient documentation

## 2015-01-24 DIAGNOSIS — R1010 Upper abdominal pain, unspecified: Secondary | ICD-10-CM | POA: Diagnosis present

## 2015-01-24 DIAGNOSIS — Z720 Tobacco use: Secondary | ICD-10-CM | POA: Insufficient documentation

## 2015-01-24 DIAGNOSIS — R1013 Epigastric pain: Secondary | ICD-10-CM

## 2015-01-24 LAB — CBC WITH DIFFERENTIAL/PLATELET
BASOS PCT: 0 %
Basophils Absolute: 0 10*3/uL (ref 0.0–0.1)
Eosinophils Absolute: 0.5 10*3/uL (ref 0.0–0.7)
Eosinophils Relative: 12 %
HEMATOCRIT: 43.2 % (ref 39.0–52.0)
HEMOGLOBIN: 14.4 g/dL (ref 13.0–17.0)
LYMPHS ABS: 1.3 10*3/uL (ref 0.7–4.0)
LYMPHS PCT: 30 %
MCH: 29.8 pg (ref 26.0–34.0)
MCHC: 33.3 g/dL (ref 30.0–36.0)
MCV: 89.4 fL (ref 78.0–100.0)
MONO ABS: 0.4 10*3/uL (ref 0.1–1.0)
MONOS PCT: 9 %
NEUTROS ABS: 2.1 10*3/uL (ref 1.7–7.7)
Neutrophils Relative %: 49 %
Platelets: 332 10*3/uL (ref 150–400)
RBC: 4.83 MIL/uL (ref 4.22–5.81)
RDW: 13 % (ref 11.5–15.5)
WBC: 4.4 10*3/uL (ref 4.0–10.5)

## 2015-01-24 LAB — COMPREHENSIVE METABOLIC PANEL
ALBUMIN: 3.5 g/dL (ref 3.5–5.0)
ALK PHOS: 61 U/L (ref 38–126)
ALT: 34 U/L (ref 17–63)
ANION GAP: 4 — AB (ref 5–15)
AST: 29 U/L (ref 15–41)
BILIRUBIN TOTAL: 0.6 mg/dL (ref 0.3–1.2)
BUN: 11 mg/dL (ref 6–20)
CALCIUM: 8.6 mg/dL — AB (ref 8.9–10.3)
CO2: 27 mmol/L (ref 22–32)
Chloride: 105 mmol/L (ref 101–111)
Creatinine, Ser: 0.95 mg/dL (ref 0.61–1.24)
GFR calc Af Amer: >60 mL/min (ref >60)
GLUCOSE: 88 mg/dL (ref 65–99)
Potassium: 3.9 mmol/L (ref 3.5–5.1)
Sodium: 136 mmol/L (ref 135–145)
Total Protein: 6.1 g/dL — ABNORMAL LOW (ref 6.5–8.1)

## 2015-01-24 LAB — ETHANOL: Alcohol, Ethyl (B): <5 mg/dL (ref <5)

## 2015-01-24 LAB — LIPASE, BLOOD: LIPASE: 150 U/L — AB (ref 11–51)

## 2015-01-24 MED ORDER — ONDANSETRON 8 MG PO TBDP: ORAL_TABLET | ORAL | Status: AC

## 2015-01-24 MED ORDER — PANTOPRAZOLE SODIUM 40 MG PO TBEC
40.0000 mg | DELAYED_RELEASE_TABLET | Freq: Once | ORAL | Status: AC
  Administered 2015-01-24: 40 mg via ORAL
  Filled 2015-01-24: qty 1

## 2015-01-24 MED ORDER — ONDANSETRON 4 MG PO TBDP
8.0000 mg | ORAL_TABLET | Freq: Once | ORAL | Status: AC
  Administered 2015-01-24: 8 mg via ORAL
  Filled 2015-01-24: qty 2

## 2015-01-24 MED ORDER — OXYCODONE-ACETAMINOPHEN 5-325 MG PO TABS
1.0000 | ORAL_TABLET | Freq: Four times a day (QID) | ORAL | Status: AC | PRN
Start: 2015-01-24 — End: ?

## 2015-01-24 NOTE — Discharge Instructions (Signed)
1. Medications: zofran, percocet, usual home medications 2. Treatment: rest, drink plenty of fluids, clear liquid diet until re-evaluated by your PCP 3. Follow Up: Please followup with your primary doctor in 2 days for discussion of your diagnoses and further evaluation after today's visit; if you do not have a primary care doctor use the resource guide provided to find one; Please return to the ER for persistent vomiting, high fevers or worsening symptoms    Acute Pancreatitis Acute pancreatitis is a disease in which the pancreas becomes suddenly inflamed. The pancreas is a large gland located behind your stomach. The pancreas produces enzymes that help digest food. The pancreas also releases the hormones glucagon and insulin that help regulate blood sugar. Damage to the pancreas occurs when the digestive enzymes from the pancreas are activated and begin attacking the pancreas before being released into the intestine. Most acute attacks last a couple of days and can cause serious complications. Some people become dehydrated and develop low blood pressure. In severe cases, bleeding into the pancreas can lead to shock and can be life-threatening. The lungs, heart, and kidneys may fail. CAUSES  Pancreatitis can happen to anyone. In some cases, the cause is unknown. Most cases are caused by:  Alcohol abuse.  Gallstones. Other less common causes are:  Certain medicines.  Exposure to certain chemicals.  Infection.  Damage caused by an accident (trauma).  Abdominal surgery. SYMPTOMS   Pain in the upper abdomen that may radiate to the back.  Tenderness and swelling of the abdomen.  Nausea and vomiting. DIAGNOSIS  Your caregiver will perform a physical exam. Blood and stool tests may be done to confirm the diagnosis. Imaging tests may also be done, such as X-rays, CT scans, or an ultrasound of the abdomen. TREATMENT  Treatment usually requires a stay in the hospital. Treatment may  include:  Pain medicine.  Fluid replacement through an intravenous line (IV).  Placing a tube in the stomach to remove stomach contents and control vomiting.  Not eating for 3 or 4 days. This gives your pancreas a rest, because enzymes are not being produced that can cause further damage.  Antibiotic medicines if your condition is caused by an infection.  Surgery of the pancreas or gallbladder. HOME CARE INSTRUCTIONS   Follow the diet advised by your caregiver. This may involve avoiding alcohol and decreasing the amount of fat in your diet.  Eat smaller, more frequent meals. This reduces the amount of digestive juices the pancreas produces.  Drink enough fluids to keep your urine clear or pale yellow.  Only take over-the-counter or prescription medicines as directed by your caregiver.  Avoid drinking alcohol if it caused your condition.  Do not smoke.  Get plenty of rest.  Check your blood sugar at home as directed by your caregiver.  Keep all follow-up appointments as directed by your caregiver. SEEK MEDICAL CARE IF:   You do not recover as quickly as expected.  You develop new or worsening symptoms.  You have persistent pain, weakness, or nausea.  You recover and then have another episode of pain. SEEK IMMEDIATE MEDICAL CARE IF:   You are unable to eat or keep fluids down.  Your pain becomes severe.  You have a fever or persistent symptoms for more than 2 to 3 days.  You have a fever and your symptoms suddenly get worse.  Your skin or the white part of your eyes turn yellow (jaundice).  You develop vomiting.  You feel dizzy, or  you faint.  Your blood sugar is high (over 300 mg/dL). MAKE SURE YOU:   Understand these instructions.  Will watch your condition.  Will get help right away if you are not doing well or get worse.   This information is not intended to replace advice given to you by your health care provider. Make sure you discuss any  questions you have with your health care provider.   Document Released: 03/20/2005 Document Revised: 09/19/2011 Document Reviewed: 06/29/2011 Elsevier Interactive Patient Education 2016 Elsevier Inc.    Low-Fat Diet for Pancreatitis or Gallbladder Conditions A low-fat diet can be helpful if you have pancreatitis or a gallbladder condition. With these conditions, your pancreas and gallbladder have trouble digesting fats. A healthy eating plan with less fat will help rest your pancreas and gallbladder and reduce your symptoms. WHAT DO I NEED TO KNOW ABOUT THIS DIET?  Eat a low-fat diet.  Reduce your fat intake to less than 20-30% of your total daily calories. This is less than 50-60 g of fat per day.  Remember that you need some fat in your diet. Ask your dietician what your daily goal should be.  Choose nonfat and low-fat healthy foods. Look for the words "nonfat," "low fat," or "fat free."  As a guide, look on the label and choose foods with less than 3 g of fat per serving. Eat only one serving.  Avoid alcohol.  Do not smoke. If you need help quitting, talk with your health care provider.  Eat small frequent meals instead of three large heavy meals. WHAT FOODS CAN I EAT? Grains Include healthy grains and starches such as potatoes, wheat bread, fiber-rich cereal, and brown rice. Choose whole grain options whenever possible. In adults, whole grains should account for 45-65% of your daily calories.  Fruits and Vegetables Eat plenty of fruits and vegetables. Fresh fruits and vegetables add fiber to your diet. Meats and Other Protein Sources Eat lean meat such as chicken and pork. Trim any fat off of meat before cooking it. Eggs, fish, and beans are other sources of protein. In adults, these foods should account for 10-35% of your daily calories. Dairy Choose low-fat milk and dairy options. Dairy includes fat and protein, as well as calcium.  Fats and Oils Limit high-fat foods such  as fried foods, sweets, baked goods, sugary drinks.  Other Creamy sauces and condiments, such as mayonnaise, can add extra fat. Think about whether or not you need to use them, or use smaller amounts or low fat options. WHAT FOODS ARE NOT RECOMMENDED?  High fat foods, such as:  [employer].  Ice cream.  [place] toast.  Sweet rolls.  Pizza.  Cheese bread.  Foods covered with batter, butter, creamy sauces, or cheese.  Fried foods.  Sugary drinks and desserts.  Foods that cause gas or bloating   This information is not intended to replace advice given to you by your health care provider. Make sure you discuss any questions you have with your health care provider.   Document Released: 03/25/2013 Document Reviewed: 03/25/2013 Elsevier Interactive Patient Education [employer]    Emergency Department Resource Guide 1) Find a Doctor and Pay Out of Pocket Although you won't have to find out who is covered by your insurance plan, it is a good idea to ask around and get recommendations. You will then need to call the office and see if the doctor you have chosen will accept you as a new patient and what types  of options they offer for patients who are self-pay. Some doctors offer discounts or will set up payment plans for their patients who do not have insurance, but you will need to ask so you aren't surprised when you get to your appointment.  2) Contact Your Local Health Department Not all health departments have doctors that can see patients for sick visits, but many do, so it is worth a call to see if yours does. If you don't know where your local health department is, you can check in your phone book. The CDC also has a tool to help you locate your state's health department, and many state websites also have listings of all of their local health departments.  3) Find a Walk-in Clinic If your illness is not likely to be very severe or complicated, you may want to try a walk  in clinic. These are popping up all over the country in pharmacies, drugstores, and shopping centers. They're usually staffed by nurse practitioners or physician assistants that have been trained to treat common illnesses and complaints. They're usually fairly quick and inexpensive. However, if you have serious medical issues or chronic medical problems, these are probably not your best option.  No Primary Care Doctor: - Call Health Connect at  [phone] - they can help you locate a primary care doctor that  accepts your insurance, provides certain services, etc. - Physician Referral Service- [phone]  Chronic Pain Problems: Organization         Address  Phone   Notes  [facility]  [phone] Patients need to be referred by their primary care doctor.   Medication Assistance: Organization         Address  Phone   Notes  [facility] Medication [facility] [address] [place] [place], [place] [address] [phone] --Must be a resident of [facility] -- Must have NO insurance coverage whatsoever (no Medicaid/ Medicare, etc.) -- The pt. MUST have a primary care doctor that directs their care regularly and follows them in the community   [facility]  [phone]   [employer]  [phone]    Agencies that provide inexpensive medical care: Organization         Address  Phone   Notes  [facility]  [phone]   [facility]    [phone]   [facility] [address] [place], [place] [address] [phone]   [facility] [address] [place], [place] [phone]   Planned Parenthood    [phone]   [facility]    [phone]   [facility]  [address], [place] Phone:  [phone], Fax:  [phone] Hours of Operation:  9 am - 6 pm, M-F.  Also accepts Medicaid/Medicare and self-pay.  [facility]  [address], [place] Phone: [phone], Fax: [phone]. Hours of Operation:  8:30 am - 5:30 pm, M-F.  Also accepts Medicaid and self-pay.  [facility] [place] [address], [place] Phone: [phone]   [facility] [address] Natasha Bence [place], [place] [phone], Ext. [phone] Mondays & Thursdays: 7-9 AM.  First 15 patients are seen on a first come, first serve basis.    Medicaid-accepting [facility] Providers:  Organization         Address  Phone   Notes  [facility] [address], [place] [phone] Also accepts self-pay patients.  [facility] [address], [place]  [phone]   [facility] [address], [place] [phone]   [facility] [address] [place] [address], [place] [phone]) [phone]   Renaye RakersVeita Bland [address], [place]   [phone] Only accepts [place] Access [place] patients after they have their name applied to their card.   Self-Pay (no insurance) in [facility]:  Organization         Address  Phone   Notes  Sickle Cell Patients, [facility] [address], [place] [phone]) [phone]   [facility] [address], [place] [phone]) [phone]   [facility] [place]  [address], [place] [phone]   [facility]/Dr. Osei-Bonsu  [address] [place] [address], [place] or [phone], [place] [phone] Phone number for both [place] and [place] locations is the same.  [facility] [address], [place] [phone]) [phone]   [facility] [address] [place] [address], [place] or [address] [phone]   [facility] [address], [place] [phone], phone; [phone]) [phone], fax Sees  patients 1st and 3rd Saturday of every month.  Must not qualify for public or private insurance (i.e. Medicaid, Medicare, [place] Health Choice, Veterans' Benefits)  Household income should be no more than 200% of the poverty level The clinic cannot treat you if you are pregnant or think you are pregnant  Sexually transmitted diseases are not treated at the clinic.    Dental Care: Organization         Address  Phone  Notes  [facility] [address], [place] [phone] Accepts children up to age 42 who are enrolled in [place] or [place] Health Choice; pregnant women with a Medicaid card; and children who have applied for Medicaid or [place] Health Choice, but were declined, whose parents can pay a reduced fee at time of service.  [facility] [place]  [address], [place] [phone] Accepts children up to age 42 who are enrolled in [place] or [place] Health Choice; pregnant women with a Medicaid card; and children who have applied for Medicaid or [place] Health Choice, but were declined, whose parents can pay a reduced fee at time of service.  [facility]  [address], [place] [phone] Patients are seen by appointment only. Walk-ins are not accepted. [facility] will see patients 42 years of age and older. Monday - Tuesday (8am-5pm) Most Wednesdays (8:30-5pm) $30 per visit, cash only  [facility]  [address], [facility] [phone] Patients are seen by appointment only. Walk-ins are not accepted. [facility] will see patients 42 years of age and older. One Wednesday Evening (Monthly: Volunteer Based).  $30 per visit, cash only  [facility]  [phone]) [phone] for adults; Children under age 534, call Graduate Pediatric Dentistry at [phone]. Children aged 64-14, please call [phone] to request a  pediatric application.  Dental services are provided in all areas of dental care including fillings, crowns and bridges, complete and partial  dentures, implants, gum treatment, root canals, and extractions. Preventive care is also provided. Treatment is provided to both adults and children. Patients are selected via a lottery and there is often a waiting list.   [facility] [address] [place]  [phone] [email or web address]   [facility] [address] [place], [place] [phone], Ext. [phone] Second and Fourth Thursday of each month, opens at 6:30 AM; Clinic ends at 9 AM.  Patients are seen on a first-come first-served basis, and a limited number are seen during each clinic.   [facility]  [address] [place], [place] [phone]   Eligibility Requirements You must have lived in [place], [place], or [place] counties for at least the last three months.   You cannot be eligible for state or federal sponsored [place], including [employer], [place], or [employer].   You generally cannot be eligible for healthcare insurance through your employer.    How to apply: Eligibility screenings are held every Tuesday and Wednesday afternoon from 1:00 pm until 4:00 pm. You do not need an appointment for the interview!  [facility] [address] [place], [place] [phone]   [facility] Health Department  [phone]   [facility] Health Department  [phone]   [facility] Health Department  [phone]    Behavioral Health Resources in the Community: Intensive Outpatient Programs Organization         Address  Phone  Notes  [facility] [address], [place], [place] [phone]   [facility] Outpatient [address] [place], [place] [phone]   [facility] [address] [place], [place]  [phone]   [facility]  Mental Health [address],  [place], [place] [phone] or [phone]   Substance Abuse Resources Organization         Address  Phone  Notes  [facility] Drug Services  [phone]   [facility]  [phone]   [facility] [place]  [phone]   Floydene Flock  [phone]   Residential & Outpatient Substance Abuse Program  [phone]   Psychological Services Organization         Address  Phone  Notes  [facility] Health  [phone]   [facility]  [phone]   [facility] [address], [place] [phone] or [phone]    Mobile Crisis Teams Organization         Address  Phone  Notes  [facility], Mobile Crisis Care Unit  [phone]   [facility]  [address] [place], [place] [phone]   Doristine Locks [address] [place] [phone]    Self-Help/Support Groups Organization         Address  Phone             Notes  [facility] [place] - variety of support groups  [phone]- I7437963 Call for more information  Narcotics Anonymous (NA), [facility] [address], [employer] [place]  2 meetings at this location   Statistician         Address  Phone  Notes  [facility] [address],    [place]  [phone]   [facility]  [address] [place] [address], [place], [place] [phone]   [facility] [address] [place], [place] [phone] Admissions: 8am-3pm M-F  Incentives  [facility] Treatment Center [address]    [place], [place] [phone]   [facility] [address] [place], [place], [place] [phone]   [facility] [address]  [place], [place] [phone]   [facility] - Intensive Outpatient [address], [place], [place] [phone]   [facility] ([facility] [address]   [place], [place] [phone] or [phone]   [facility] ([facility]) [address] [place], [place] [phone] Accepts Medicaid  [facility] [address],  [place] [phone] Substance Abuse/Addiction Treatment   [facility] Organization         Address  Phone  Notes  [facility]  [phone]   Angie Fava, PhD [address] Knack [place], [place]   [phone] or [phone]   [facility]al   [address] [place], [place] [phone]   [facility] [address], [place], [place] [phone] Insurance/Medicaid/sponsorship through [facility] [address]                                    [place], [place] [phone] Therapy/tele-psych/case  [facility] [address], [place] [phone]    Dr. Lolly Mustache  [phone]   [facility]  [employer] [facility] Dept. 1) [address] [place] 2) [address], [place] 3)  [address], [place] [phone]  [phone]   [facility] Child Abuse Hotline (209)083-4100 or 661-472-0674 (After Hours)

## 2015-01-24 NOTE — ED Notes (Signed)
PA at bedside.

## 2015-01-24 NOTE — ED Notes (Signed)
Patient transported to Ultrasound 

## 2015-01-24 NOTE — ED Notes (Signed)
Per Pt he has experienced abdominal pain x 1 week; Pt describes pain as cramping upper to mid area; Pt c/o of as 7/10 on arrival but pain comes and goes; Pt states he has has nausea but has not actually vomited; Pt states he was seen last week for the same.
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
## 2017-04-29 ENCOUNTER — Emergency Department (HOSPITAL_COMMUNITY): Payer: Self-pay

## 2017-04-29 ENCOUNTER — Encounter (HOSPITAL_COMMUNITY): Payer: Self-pay | Admitting: Emergency Medicine

## 2017-04-29 DIAGNOSIS — R072 Precordial pain: Secondary | ICD-10-CM | POA: Insufficient documentation

## 2017-04-29 DIAGNOSIS — F17210 Nicotine dependence, cigarettes, uncomplicated: Secondary | ICD-10-CM | POA: Insufficient documentation

## 2017-04-29 LAB — CBC
HEMATOCRIT: 39.2 % (ref 39.0–52.0)
Hemoglobin: 13 g/dL (ref 13.0–17.0)
MCH: 30.4 pg (ref 26.0–34.0)
MCHC: 33.2 g/dL (ref 30.0–36.0)
MCV: 91.8 fL (ref 78.0–100.0)
Platelets: 221 10*3/uL (ref 150–400)
RBC: 4.27 MIL/uL (ref 4.22–5.81)
RDW: 13.4 % (ref 11.5–15.5)
WBC: 4.8 10*3/uL (ref 4.0–10.5)

## 2017-04-29 LAB — BASIC METABOLIC PANEL
Anion gap: 9 (ref 5–15)
BUN: 11 mg/dL (ref 6–20)
CALCIUM: 8.5 mg/dL — AB (ref 8.9–10.3)
CO2: 27 mmol/L (ref 22–32)
Chloride: 103 mmol/L (ref 101–111)
Creatinine, Ser: 1.15 mg/dL (ref 0.61–1.24)
GFR calc Af Amer: >60 mL/min (ref >60)
GLUCOSE: 106 mg/dL — AB (ref 65–99)
Potassium: 3.7 mmol/L (ref 3.5–5.1)
Sodium: 139 mmol/L (ref 135–145)

## 2017-04-29 LAB — I-STAT TROPONIN, ED: TROPONIN I, POC: 0.04 ng/mL (ref 0.00–0.08)

## 2017-04-29 NOTE — ED Triage Notes (Signed)
Reports having sharp left sided chest pain for a couple of days.

## 2017-04-30 ENCOUNTER — Emergency Department (HOSPITAL_COMMUNITY)
Admission: EM | Admit: 2017-04-30 | Discharge: 2017-04-30 | Disposition: A | Discharge status: Home / Self Care | Payer: Self-pay | Attending: Emergency Medicine | Admitting: Emergency Medicine

## 2017-04-30 ENCOUNTER — Encounter (HOSPITAL_COMMUNITY): Payer: Self-pay | Admitting: Emergency Medicine

## 2017-04-30 DIAGNOSIS — R072 Precordial pain: Secondary | ICD-10-CM

## 2017-04-30 LAB — I-STAT TROPONIN, ED: Troponin i, poc: 0.04 ng/mL (ref 0.00–0.08)

## 2017-04-30 LAB — D-DIMER, QUANTITATIVE (NOT AT ARMC)

## 2017-04-30 MED ORDER — KETOROLAC TROMETHAMINE 30 MG/ML IJ SOLN
30.0000 mg | Freq: Once | INTRAMUSCULAR | Status: AC
  Administered 2017-04-30: 30 mg via INTRAVENOUS
  Filled 2017-04-30: qty 1

## 2017-04-30 MED ORDER — GI COCKTAIL ~~LOC~~
30.0000 mL | Freq: Once | ORAL | Status: AC
  Administered 2017-04-30: 30 mL via ORAL
  Filled 2017-04-30: qty 30

## 2017-04-30 MED ORDER — OMEPRAZOLE 20 MG PO CPDR: 20.0000 mg | DELAYED_RELEASE_CAPSULE | Freq: Every day | ORAL | 0 refills | Status: AC
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
## 2017-07-08 ENCOUNTER — Emergency Department (HOSPITAL_COMMUNITY)
Admission: EM | Admit: 2017-07-08 | Discharge: 2017-07-08 | Disposition: A | Discharge status: Home / Self Care | Payer: Self-pay | Attending: Emergency Medicine | Admitting: Emergency Medicine

## 2017-07-08 ENCOUNTER — Other Ambulatory Visit: Payer: Self-pay

## 2017-07-08 ENCOUNTER — Encounter (HOSPITAL_COMMUNITY): Payer: Self-pay

## 2017-07-08 DIAGNOSIS — F17210 Nicotine dependence, cigarettes, uncomplicated: Secondary | ICD-10-CM | POA: Insufficient documentation

## 2017-07-08 DIAGNOSIS — Z79899 Other long term (current) drug therapy: Secondary | ICD-10-CM | POA: Insufficient documentation

## 2017-07-08 DIAGNOSIS — R21 Rash and other nonspecific skin eruption: Secondary | ICD-10-CM | POA: Insufficient documentation

## 2017-07-08 MED ORDER — CHLORHEXIDINE GLUCONATE 4 % EX LIQD
Freq: Every day | CUTANEOUS | 0 refills | Status: AC | PRN
Start: 2017-07-08 — End: ?

## 2017-07-08 MED ORDER — LORATADINE 10 MG PO TABS
10.0000 mg | ORAL_TABLET | Freq: Every day | ORAL | Status: DC
  Administered 2017-07-08: 10 mg via ORAL
  Filled 2017-07-08: qty 1

## 2017-07-08 MED ORDER — FAMOTIDINE 20 MG PO TABS
20.0000 mg | ORAL_TABLET | Freq: Once | ORAL | Status: AC
  Administered 2017-07-08: 20 mg via ORAL
  Filled 2017-07-08: qty 1

## 2017-07-08 MED ORDER — FAMOTIDINE 20 MG PO TABS: 20.0000 mg | ORAL_TABLET | Freq: Two times a day (BID) | ORAL | 0 refills | Status: AC

## 2017-07-08 MED ORDER — PREDNISONE 10 MG PO TABS: 40.0000 mg | ORAL_TABLET | Freq: Every day | ORAL | 0 refills | Status: AC

## 2017-07-08 MED ORDER — DIPHENHYDRAMINE HCL 25 MG PO CAPS
25.0000 mg | ORAL_CAPSULE | Freq: Once | ORAL | Status: DC
  Filled 2017-07-08: qty 1

## 2017-07-08 MED ORDER — CETIRIZINE HCL 10 MG PO TABS: 10.0000 mg | ORAL_TABLET | Freq: Two times a day (BID) | ORAL | 0 refills | Status: AC

## 2017-07-08 MED ORDER — PREDNISONE 20 MG PO TABS
60.0000 mg | ORAL_TABLET | Freq: Once | ORAL | Status: AC
  Administered 2017-07-08: 60 mg via ORAL
  Filled 2017-07-08: qty 3

## 2017-07-08 NOTE — ED Notes (Signed)
Pt stable, ambulatory, and verbalizes understanding of d/c instructions.  
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
## 2017-07-08 NOTE — ED Triage Notes (Signed)
Pt reports he tried to dye his hair on Friday and he believes he was allergic because he has itching and some swelling to his beard area. Pt denies SOB. Pt reports taking benadryl without relief. Some redness and swelling noted to face

## 2017-07-08 NOTE — Discharge Instructions (Addendum)
Take prednisone starting tomorrow for the next 4 days. Take pepcid and Benadryl or zyrtec as needed.  Use mild soap or Hibiclens and warm compresses. Follow up with your primary care provider.  Return if symptoms worsen swelling, pain, redness, warmth, fever or other new concerning symptoms in the meantime

## 2018-03-09 ENCOUNTER — Emergency Department (HOSPITAL_COMMUNITY): Payer: No Typology Code available for payment source

## 2018-03-09 ENCOUNTER — Emergency Department (HOSPITAL_COMMUNITY)
Admission: EM | Admit: 2018-03-09 | Discharge: 2018-03-09 | Disposition: A | Discharge status: Home / Self Care | Payer: No Typology Code available for payment source | Attending: Emergency Medicine | Admitting: Emergency Medicine

## 2018-03-09 ENCOUNTER — Encounter (HOSPITAL_COMMUNITY): Payer: Self-pay

## 2018-03-09 ENCOUNTER — Other Ambulatory Visit: Payer: Self-pay

## 2018-03-09 DIAGNOSIS — S161XXA Strain of muscle, fascia and tendon at neck level, initial encounter: Secondary | ICD-10-CM | POA: Diagnosis not present

## 2018-03-09 DIAGNOSIS — Y999 Unspecified external cause status: Secondary | ICD-10-CM | POA: Diagnosis not present

## 2018-03-09 DIAGNOSIS — S199XXA Unspecified injury of neck, initial encounter: Secondary | ICD-10-CM | POA: Diagnosis present

## 2018-03-09 DIAGNOSIS — Y92410 Unspecified street and highway as the place of occurrence of the external cause: Secondary | ICD-10-CM | POA: Insufficient documentation

## 2018-03-09 DIAGNOSIS — Y9389 Activity, other specified: Secondary | ICD-10-CM | POA: Diagnosis not present

## 2018-03-09 DIAGNOSIS — Z79899 Other long term (current) drug therapy: Secondary | ICD-10-CM | POA: Insufficient documentation

## 2018-03-09 DIAGNOSIS — F17210 Nicotine dependence, cigarettes, uncomplicated: Secondary | ICD-10-CM | POA: Insufficient documentation

## 2018-03-09 MED ORDER — CYCLOBENZAPRINE HCL 10 MG PO TABS: 10.0000 mg | ORAL_TABLET | Freq: Two times a day (BID) | ORAL | 0 refills | Status: AC | PRN
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
## 2018-03-09 NOTE — ED Notes (Signed)
Patient verbalizes understanding of discharge instructions. Opportunity for questioning and answers were provided. 

## 2018-03-09 NOTE — Discharge Instructions (Signed)
Take over the counter medication as needed for pain, you can also take the muscle relaxant as needed

## 2019-04-12 IMAGING — DX DG CHEST 2V
2 series · 2 of 2 positions shown · non-contrast
Comparison: Chest radiograph 01/24/2015.

CLINICAL DATA: Patient with left-sided chest pain.

EXAM:
CHEST  2 VIEW

[chest pa]
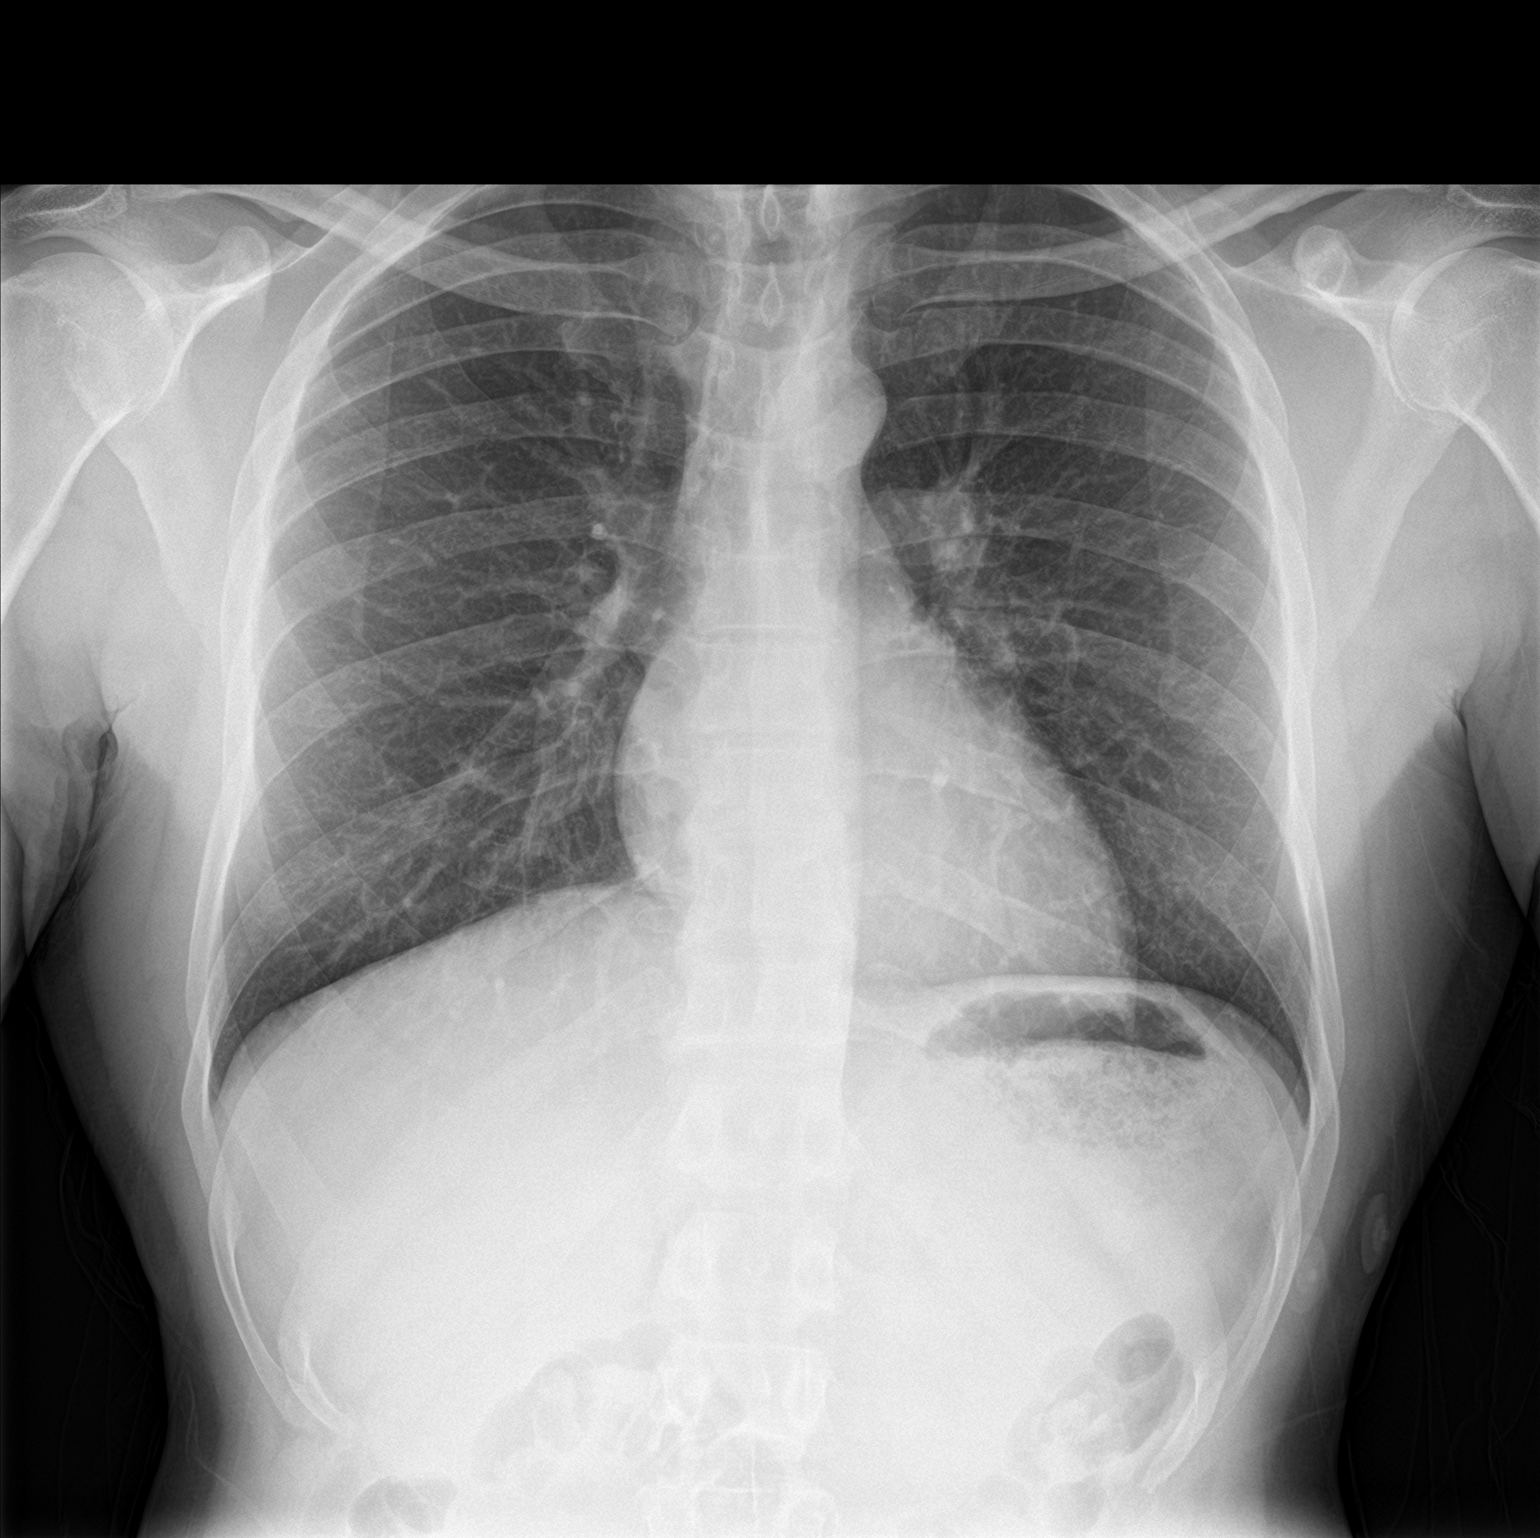

[chest lat]
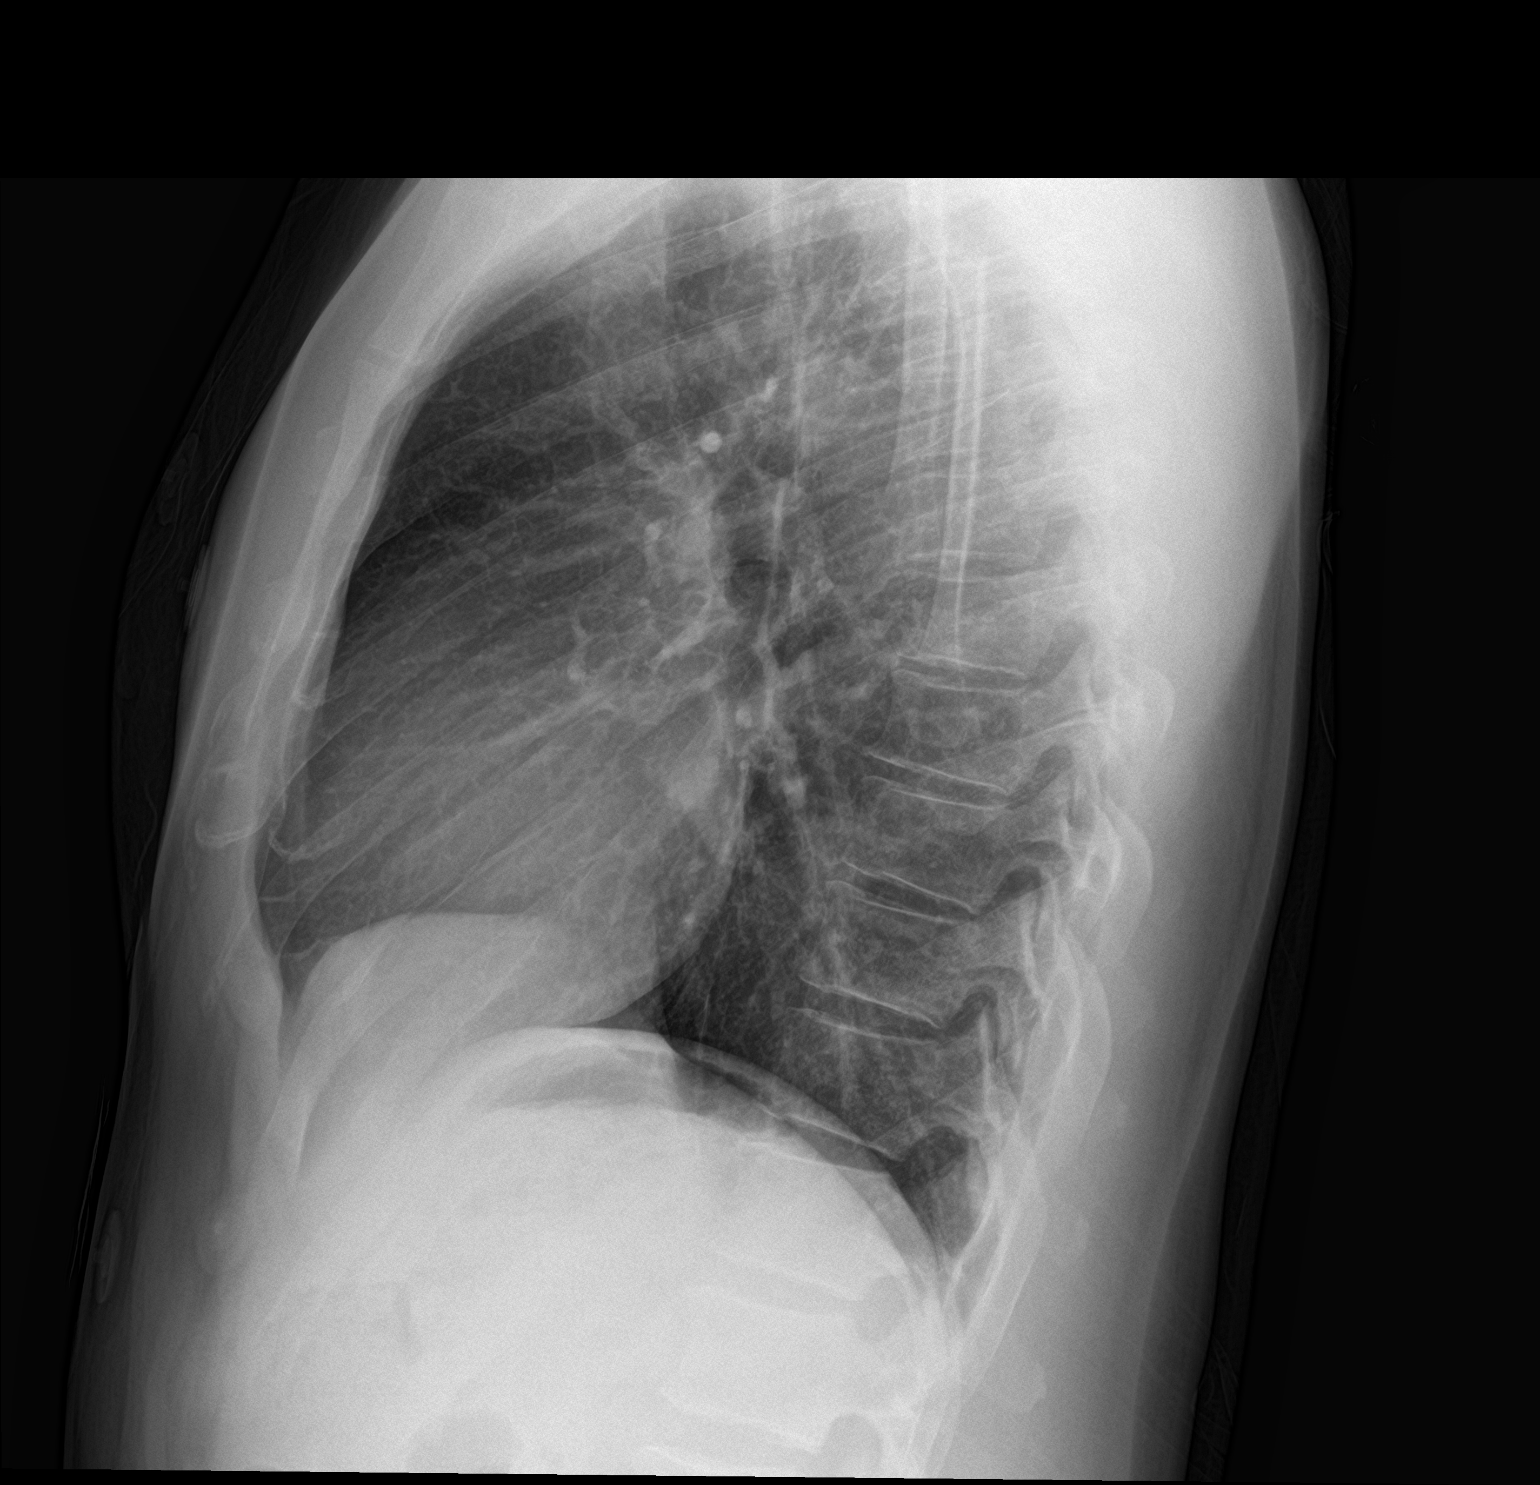

[2 of 2 positions shown; findings below may reference images not displayed]

FINDINGS: Normal cardiac and mediastinal contours. No consolidative pulmonary
opacities. No pleural effusion or pneumothorax. Thoracic spine
degenerative changes.
IMPRESSION: No active cardiopulmonary disease.

## 2020-08-16 ENCOUNTER — Ambulatory Visit (HOSPITAL_COMMUNITY)
Admission: EM | Admit: 2020-08-16 | Discharge: 2020-08-16 | Disposition: A | Discharge status: Home / Self Care | Payer: PRIVATE HEALTH INSURANCE | Attending: Physician Assistant | Admitting: Physician Assistant

## 2020-08-16 ENCOUNTER — Other Ambulatory Visit: Payer: Self-pay

## 2020-08-16 ENCOUNTER — Encounter (HOSPITAL_COMMUNITY): Payer: Self-pay

## 2020-08-16 DIAGNOSIS — M25561 Pain in right knee: Secondary | ICD-10-CM

## 2020-08-16 DIAGNOSIS — M7989 Other specified soft tissue disorders: Secondary | ICD-10-CM

## 2020-08-16 DIAGNOSIS — M79604 Pain in right leg: Secondary | ICD-10-CM

## 2020-08-16 DIAGNOSIS — G8929 Other chronic pain: Secondary | ICD-10-CM

## 2020-08-16 MED ORDER — NAPROXEN 500 MG PO TABS: 500.0000 mg | ORAL_TABLET | Freq: Two times a day (BID) | ORAL | 0 refills | Status: AC

## 2020-08-16 NOTE — Discharge Instructions (Signed)
I am concerned about a blood clot as it is important you go get the ultrasound tomorrow.  You can use Naprosyn to help with pain but should not take additional NSAIDs including aspirin, ibuprofen/Advil, naproxen/Aleve with this medication due to risk of bleeding.  If you have any worsening symptoms including chest pain or shortness of breath you need to go to the emergency room.  Try to stay off leg and keep it elevated.  You can use compression (wrapping) for additional symptom relief.  Follow-up with sports medicine if symptoms persist.

## 2020-08-16 NOTE — ED Triage Notes (Signed)
Pt in with c/o right leg pain x 1 month  Pt states he used Aspercreme for relief
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
## 2020-08-17 ENCOUNTER — Ambulatory Visit (HOSPITAL_COMMUNITY)
Admission: RE | Admit: 2020-08-17 | Discharge: 2020-08-17 | Disposition: A | Discharge status: Home / Self Care | Payer: Self-pay | Source: Ambulatory Visit | Attending: Physician Assistant | Admitting: Physician Assistant

## 2020-08-17 DIAGNOSIS — R600 Localized edema: Secondary | ICD-10-CM | POA: Insufficient documentation

## 2020-08-17 DIAGNOSIS — M7989 Other specified soft tissue disorders: Secondary | ICD-10-CM

## 2020-08-17 DIAGNOSIS — M79604 Pain in right leg: Secondary | ICD-10-CM

## 2020-08-17 NOTE — Progress Notes (Signed)
Lower extremity venous has been completed.   Preliminary results in CV Proc.   Blanch Media 08/17/2020 11:38 AM
